# Patient Record
Sex: Female | Born: 1940 | Race: White | Hispanic: No | Marital: Married | Smoking: Never smoker
Health system: Southern US, Community
[De-identification: ages and names within clinical notes are randomized; demographics above are authoritative.]

## PROBLEM LIST (undated history)

## (undated) DIAGNOSIS — I25810 Atherosclerosis of coronary artery bypass graft(s) without angina pectoris: Secondary | ICD-10-CM

## (undated) DIAGNOSIS — E782 Mixed hyperlipidemia: Secondary | ICD-10-CM

## (undated) DIAGNOSIS — I10 Essential (primary) hypertension: Secondary | ICD-10-CM

## (undated) DIAGNOSIS — I6529 Occlusion and stenosis of unspecified carotid artery: Secondary | ICD-10-CM

## (undated) HISTORY — DX: Occlusion and stenosis of unspecified carotid artery: I65.29

## (undated) HISTORY — DX: Mixed hyperlipidemia: E78.2

## (undated) HISTORY — PX: TUBAL LIGATION: SHX77

## (undated) HISTORY — DX: Essential (primary) hypertension: I10

## (undated) HISTORY — DX: Atherosclerosis of coronary artery bypass graft(s) without angina pectoris: I25.810

## (undated) HISTORY — PX: NOSE SURGERY: SHX723

---

## 2000-01-06 ENCOUNTER — Other Ambulatory Visit: Admission: RE | Admit: 2000-01-06 | Discharge: 2000-01-06 | Payer: Self-pay | Admitting: Internal Medicine

## 2000-09-18 ENCOUNTER — Encounter: Payer: Self-pay | Admitting: Family Medicine

## 2000-09-18 ENCOUNTER — Encounter: Admission: RE | Admit: 2000-09-18 | Discharge: 2000-09-18 | Payer: Self-pay | Admitting: Family Medicine

## 2001-10-05 ENCOUNTER — Inpatient Hospital Stay (HOSPITAL_COMMUNITY): Admission: EM | Admit: 2001-10-05 | Discharge: 2001-10-20 | Payer: Self-pay | Admitting: Emergency Medicine

## 2001-10-05 ENCOUNTER — Encounter: Payer: Self-pay | Admitting: Emergency Medicine

## 2001-10-06 ENCOUNTER — Encounter: Payer: Self-pay | Admitting: Cardiology

## 2001-10-10 ENCOUNTER — Encounter: Payer: Self-pay | Admitting: Surgery

## 2001-10-10 HISTORY — PX: CORONARY ARTERY BYPASS GRAFT: SHX141

## 2001-10-11 ENCOUNTER — Encounter: Payer: Self-pay | Admitting: Surgery

## 2001-10-12 ENCOUNTER — Encounter: Payer: Self-pay | Admitting: Surgery

## 2001-10-13 ENCOUNTER — Encounter: Payer: Self-pay | Admitting: Cardiology

## 2001-10-14 ENCOUNTER — Encounter: Payer: Self-pay | Admitting: Surgery

## 2001-10-15 ENCOUNTER — Encounter: Payer: Self-pay | Admitting: Surgery

## 2002-03-05 ENCOUNTER — Encounter: Payer: Self-pay | Admitting: Orthopedic Surgery

## 2002-03-11 ENCOUNTER — Inpatient Hospital Stay (HOSPITAL_COMMUNITY): Admission: RE | Admit: 2002-03-11 | Discharge: 2002-03-16 | Payer: Self-pay | Admitting: Orthopedic Surgery

## 2002-03-11 ENCOUNTER — Encounter: Payer: Self-pay | Admitting: Orthopedic Surgery

## 2002-03-11 HISTORY — PX: OTHER SURGICAL HISTORY: SHX169

## 2002-05-28 ENCOUNTER — Encounter: Payer: Self-pay | Admitting: Orthopedic Surgery

## 2002-06-03 ENCOUNTER — Inpatient Hospital Stay (HOSPITAL_COMMUNITY): Admission: RE | Admit: 2002-06-03 | Discharge: 2002-06-07 | Payer: Self-pay | Admitting: Orthopedic Surgery

## 2002-06-03 ENCOUNTER — Encounter: Payer: Self-pay | Admitting: Orthopedic Surgery

## 2004-04-05 ENCOUNTER — Ambulatory Visit: Payer: Self-pay

## 2004-04-15 ENCOUNTER — Ambulatory Visit: Payer: Self-pay | Admitting: Cardiology

## 2005-02-04 ENCOUNTER — Ambulatory Visit: Payer: Self-pay | Admitting: Cardiology

## 2005-02-22 ENCOUNTER — Ambulatory Visit: Payer: Self-pay | Admitting: Cardiology

## 2005-02-22 ENCOUNTER — Ambulatory Visit: Payer: Self-pay

## 2005-08-23 ENCOUNTER — Ambulatory Visit: Payer: Self-pay

## 2006-01-31 ENCOUNTER — Ambulatory Visit: Payer: Self-pay | Admitting: Cardiology

## 2006-02-07 ENCOUNTER — Ambulatory Visit: Payer: Self-pay | Admitting: Cardiology

## 2006-07-31 ENCOUNTER — Ambulatory Visit: Payer: Self-pay | Admitting: Cardiology

## 2006-08-04 ENCOUNTER — Ambulatory Visit: Payer: Self-pay

## 2007-02-13 ENCOUNTER — Ambulatory Visit: Payer: Self-pay | Admitting: Cardiology

## 2007-02-20 ENCOUNTER — Ambulatory Visit: Payer: Self-pay

## 2007-02-20 ENCOUNTER — Ambulatory Visit: Payer: Self-pay | Admitting: Cardiology

## 2007-02-20 LAB — CONVERTED CEMR LAB
ALT: 23 units/L (ref 0–35)
Albumin: 4 g/dL (ref 3.5–5.2)
Alkaline Phosphatase: 50 units/L (ref 39–117)
BUN: 14 mg/dL (ref 6–23)
CO2: 29 meq/L (ref 19–32)
Calcium: 9.4 mg/dL (ref 8.4–10.5)
GFR calc Af Amer: 64 mL/min
GFR calc non Af Amer: 53 mL/min
Potassium: 3.2 meq/L — ABNORMAL LOW (ref 3.5–5.1)
Total CHOL/HDL Ratio: 4.2
Triglycerides: 170 mg/dL — ABNORMAL HIGH (ref 0–149)
VLDL: 34 mg/dL (ref 0–40)

## 2007-08-21 ENCOUNTER — Ambulatory Visit: Payer: Self-pay

## 2007-08-23 ENCOUNTER — Ambulatory Visit: Payer: Self-pay | Admitting: Cardiology

## 2007-10-03 ENCOUNTER — Ambulatory Visit: Payer: Self-pay | Admitting: Cardiology

## 2007-10-03 LAB — CONVERTED CEMR LAB
BUN: 17 mg/dL (ref 6–23)
CO2: 34 meq/L — ABNORMAL HIGH (ref 19–32)
Chloride: 105 meq/L (ref 96–112)
Creatinine, Ser: 1 mg/dL (ref 0.4–1.2)
GFR calc non Af Amer: 59 mL/min
Glucose, Bld: 108 mg/dL — ABNORMAL HIGH (ref 70–99)
Potassium: 4.8 meq/L (ref 3.5–5.1)

## 2007-10-23 ENCOUNTER — Ambulatory Visit: Payer: Self-pay | Admitting: Cardiology

## 2008-04-23 ENCOUNTER — Ambulatory Visit: Payer: Self-pay | Admitting: Cardiology

## 2008-05-01 ENCOUNTER — Ambulatory Visit: Payer: Self-pay | Admitting: Cardiology

## 2008-05-01 LAB — CONVERTED CEMR LAB
ALT: 22 units/L (ref 0–35)
Bilirubin, Direct: 0.2 mg/dL (ref 0.0–0.3)
HDL: 28.8 mg/dL — ABNORMAL LOW (ref >39.0)
Hgb A1c MFr Bld: 6.4 % — ABNORMAL HIGH (ref 4.6–6.0)
LDL Cholesterol: 84 mg/dL (ref 0–99)
Total Bilirubin: 1.4 mg/dL — ABNORMAL HIGH (ref 0.3–1.2)
Total CHOL/HDL Ratio: 4.7
VLDL: 22 mg/dL (ref 0–40)

## 2008-08-20 ENCOUNTER — Ambulatory Visit: Payer: Self-pay | Admitting: Cardiology

## 2008-08-20 ENCOUNTER — Encounter: Payer: Self-pay | Admitting: Cardiology

## 2008-08-20 ENCOUNTER — Ambulatory Visit: Payer: Self-pay

## 2008-08-20 DIAGNOSIS — I10 Essential (primary) hypertension: Secondary | ICD-10-CM | POA: Insufficient documentation

## 2008-08-20 DIAGNOSIS — E785 Hyperlipidemia, unspecified: Secondary | ICD-10-CM | POA: Insufficient documentation

## 2008-08-20 DIAGNOSIS — I6529 Occlusion and stenosis of unspecified carotid artery: Secondary | ICD-10-CM | POA: Insufficient documentation

## 2008-08-20 DIAGNOSIS — I25810 Atherosclerosis of coronary artery bypass graft(s) without angina pectoris: Secondary | ICD-10-CM | POA: Insufficient documentation

## 2008-12-26 ENCOUNTER — Encounter (INDEPENDENT_AMBULATORY_CARE_PROVIDER_SITE_OTHER): Payer: Self-pay | Admitting: *Deleted

## 2009-03-20 ENCOUNTER — Ambulatory Visit: Payer: Self-pay | Admitting: Cardiology

## 2009-04-20 ENCOUNTER — Telehealth (INDEPENDENT_AMBULATORY_CARE_PROVIDER_SITE_OTHER): Payer: Self-pay

## 2009-04-21 ENCOUNTER — Ambulatory Visit: Payer: Self-pay | Admitting: Cardiology

## 2009-04-21 ENCOUNTER — Encounter (HOSPITAL_COMMUNITY): Admission: RE | Admit: 2009-04-21 | Discharge: 2009-05-27 | Payer: Self-pay | Admitting: Cardiology

## 2009-04-21 ENCOUNTER — Ambulatory Visit: Payer: Self-pay

## 2009-04-24 LAB — CONVERTED CEMR LAB
ALT: 18 units/L (ref 0–35)
Albumin: 4.2 g/dL (ref 3.5–5.2)
BUN: 13 mg/dL (ref 6–23)
Bilirubin, Direct: 0.1 mg/dL (ref 0.0–0.3)
Cholesterol: 147 mg/dL (ref 0–200)
Creatinine, Ser: 1.1 mg/dL (ref 0.4–1.2)
GFR calc non Af Amer: 52.21 mL/min (ref >60)
Hgb A1c MFr Bld: 6.3 % (ref 4.6–6.5)
Potassium: 4.1 meq/L (ref 3.5–5.1)
Total Protein: 8 g/dL (ref 6.0–8.3)
Triglycerides: 132 mg/dL (ref 0.0–149.0)
VLDL: 26.4 mg/dL (ref 0.0–40.0)

## 2009-08-21 ENCOUNTER — Encounter: Payer: Self-pay | Admitting: Cardiology

## 2009-08-24 ENCOUNTER — Ambulatory Visit: Payer: Self-pay

## 2009-08-24 ENCOUNTER — Ambulatory Visit: Payer: Self-pay | Admitting: Cardiology

## 2009-08-24 DIAGNOSIS — I739 Peripheral vascular disease, unspecified: Secondary | ICD-10-CM | POA: Insufficient documentation

## 2010-03-15 ENCOUNTER — Ambulatory Visit: Payer: Self-pay | Admitting: Cardiology

## 2010-06-27 LAB — CONVERTED CEMR LAB
Bilirubin, Direct: 0.2 mg/dL (ref 0.0–0.3)
Calcium: 9.3 mg/dL (ref 8.4–10.5)
Chloride: 104 meq/L (ref 96–112)
Creatinine, Ser: 1 mg/dL (ref 0.4–1.2)
HDL: 38.3 mg/dL — ABNORMAL LOW (ref >39.00)
LDL Cholesterol: 71 mg/dL (ref 0–99)
Sodium: 142 meq/L (ref 135–145)
Total Bilirubin: 1.2 mg/dL (ref 0.3–1.2)
Total CHOL/HDL Ratio: 3
Triglycerides: 79 mg/dL (ref 0.0–149.0)
VLDL: 15.8 mg/dL (ref 0.0–40.0)

## 2010-06-29 NOTE — Miscellaneous (Signed)
Summary: Orders Update  Clinical Lists Changes  Orders: Added new Test order of Carotid Duplex (Carotid Duplex) - Signed 

## 2010-06-29 NOTE — Assessment & Plan Note (Signed)
Summary: rov appt with missy at 3pm      Allergies Added: NKDA  Visit Type:  rov Primary Provider:  Harrison Mons  CC:  shortness of breath when going up hall.  History of Present Illness: Ms Verge comes in today for evaluation and management of her vascular disease and cardiac disease.  She's not had any angina or ischemic symptoms. Her last stress Myoview was negative for ischemia.  She's had no symptoms of TIAs or mini strokes. Her carotids today shows 60-79% bilateral carotid stenoses with antegrade flow in both vertebrals. There was no sign of subclavian stenosis.  She denies any orthopnea, PND or peripheral edema. She has lost about 5 pounds. She is very compliant with her medications.  Current Medications (verified): 1)  Potassium Chloride Cr 10 Meq Cr-Tabs (Potassium Chloride) .Marland Kitchen.. 1 Tab Once Daily 2)  Vytorin 10-40 Mg Tabs (Ezetimibe-Simvastatin) .Marland Kitchen.. 1 Tab Once Daily 3)  Amlodipine Besy-Benazepril Hcl 5-20 Mg Caps (Amlodipine Besy-Benazepril Hcl) .Marland Kitchen.. 1 Tab Once Daily 4)  Hydrochlorothiazide 12.5 Mg Tabs (Hydrochlorothiazide) .Marland Kitchen.. 1 Tab Once Daily 5)  Carvedilol 25 Mg Tabs (Carvedilol) .Marland Kitchen.. 1 Tab Two Times A Day 6)  Aspirin 81 Mg Tbec (Aspirin) .... Take One Tablet By Mouth Daily 7)  Fish Oil   Oil (Fish Oil) .... Temp Stop 8)  Multivitamins   Tabs (Multiple Vitamin) .Marland Kitchen.. 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  Alcohol Use - no Drug Use - no  Risk Factors: Smoking Status: never (Sep 16, 2008)  Review of Systems       negative otherwise  Vital Signs:  Patient profile:   70 year old female Height:      67 inches Weight:      185 pounds BMI:     29.08 BP sitting:   108 / 60  (left arm) BP standing:   122 / 60  (right arm) Cuff size:   large  Vitals Entered By: Oswald Hillock (August 24, 2009 3:21 PM)  Physical Exam  General:  overweight Head:  normocephalic and atraumatic Eyes:  PERRLA/EOM intact; conjunctiva and lids normal. Neck:  Neck supple, no JVD. No masses, thyromegaly or abnormal cervical nodes. Chest Sheritta Deeg:  no deformities or breast masses noted Lungs:  Clear bilaterally to auscultation and percussion. Heart:  PMI difficult to appreciate, normal S1-S2 regular rate and rhythm Msk:  Back normal, normal gait. Muscle strength and tone normal. Pulses:  pulses normal in all 4 extremities Extremities:  No clubbing or cyanosis. Neurologic:  Alert and oriented x 3. Skin:  Intact without lesions or rashes. Psych:  Normal affect.   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 1 tab once daily    Carvedilol 25 Mg Tabs (Carvedilol) .Marland Kitchen... 1 tab two times a day  Aspirin 81 Mg Tbec (Aspirin) .Marland Kitchen... 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 Her updated medication list for this problem includes:    Aspirin 81 Mg Tbec (Aspirin) .Marland Kitchen... Take one tablet by mouth daily  Problem # 3:  HYPERTENSION, BENIGN  (ICD-401.1) Assessment: Improved  Her updated medication list for this problem includes:    Amlodipine Besy-benazepril Hcl 5-20 Mg Caps (Amlodipine besy-benazepril hcl) .Marland Kitchen... 1 tab once daily    Hydrochlorothiazide 12.5 Mg Tabs (Hydrochlorothiazide) .Marland Kitchen... 1 tab once daily    Carvedilol 25 Mg Tabs (Carvedilol) .Marland Kitchen... 1 tab two times a day    Aspirin 81 Mg Tbec (Aspirin) .Marland Kitchen... Take one tablet by mouth daily  Problem # 4:  HYPERLIPIDEMIA-MIXED (ICD-272.4)  Her updated medication list for this problem includes:    Vytorin 10-40 Mg Tabs (Ezetimibe-simvastatin) .Marland Kitchen... 1 tab once daily  Problem # 5:  PVD (ICD-443.9) Assessment: New She t.has about a 12-14 mm difference in systolic pressure between the left upper extremity and right upper extremity. She most likely has some subclavian stenosis is asymptomatic.  Clinical Reports Reviewed:  Nuclear Study:  04/21/2009:  Excerise capacity: Good exercise capacity  Blood Pressure response: Normal blood pressure response  Clinical symptoms: No chest pain  ECG impression: No significant ST segment change suggestive of ischemia Overall impression: There is an old, dense antero[apical scar. There is no ischemia.  Willa Rough, MD, [facility]   02/20/2007:  Excerise capacity: Good exercise capactiy  Blood Pressure response: Normal blood pressure response  Clinical symptoms: Fatigue  ECG impression: No significant ST segment change suggestive of ischemia  Overall impression: No change from 2006. There is an old anter-apical scar. There is mild peri-infarct ischemia.   02/22/2005:  Final Interpretation: Abnormal stress Myoview with no chest pain and no electrocardiographic changes. The scintigraphic results show a large prior distal anterior and apical infarct, but there is no ischemia on this study. the gated ejection fraction was 57%, and there was akinesis of the apex.   Patient Instructions: 1)  Your physician recommends that you schedule a  follow-up appointment in: 6 MONTHS WITH DR Montserrath Madding 2)  Your physician recommends that you continue on your current medications as directed. Please refer to the Current Medication list given to you today.

## 2010-06-29 NOTE — Assessment & Plan Note (Signed)
Summary: 6 mon f/u./cy   Visit Type:  6 mo f/u Primary Provider:  Harrison Mons  CC:  sob w/inclines...edema/feet in the summer....denies any cp.  History of Present Illness: [name] comes in today for further evaluation and management of her coronary artery disease, carotid artery disease, hyperlipidemia, and hypertension.  She's had no symptoms of angina or ischemia. No symptoms of TIAs or mini strokes.  Her weight is stable.  Last stress Myoview November 2000 and stable. EF 61%. Please see report.  Carotid Dopplers August 24, 2009 were stable. Followup in a year.  Laboratory data a year ago in November showed total cholesterol 147, triglycerides 132, HDL 35.6, LDL 85, LFTs were normal, chemistry was normal. Reviewed with patient today.  She is compliant with medications. May profile reviewed.  Clinical Reports Reviewed:  Cardiac Cath:  10/05/2001: Cardiac Cath Findings:  CONCLUSIONS: Acute anterior wall myocardial infarction complicated by congestive heart failure with 99% stenosis in the proximal to mid left anterior descending with diffuse disease, and TIMI-3 flow, 80 and 90% stenosis in the first and second posterolateral branch of the circumflex artery, 70% stenosis in the mid right coronary artery with 90% in the posterior descending branch and anterolateral wall and apical wall akinesis and inferoapical wall hypokinesis with an ejection fraction of 25-30%.  RECOMMENDATIONS: The patients coronary anatomy is very unfavorable for percutaneous intervention. We will plan to stabilize the patient with medical therapy with ACE inhibitor, beta blockers, heparin and Integrilin. If she has recurrent pain or hemodynamic compromise, then we will recommend placement of an intra-aortic balloon pump. Hopefully, we will be able to stabilize her condition medically, and hopefully she will have some improvement in left ventricular function and be a candidate for bypass surgery.  We have CVTS group to see her with [place] and follow her with [place]. Dictated by:   Everardo Beals Juanda Chance, M.D. [facility] Attending Physician:  Mirian Mo DD:  10/06/01  Nuclear Study:  04/21/2009:  Excerise capacity: Good exercise capacity  Blood Pressure response: Normal blood pressure response  Clinical symptoms: No chest pain  ECG impression: No significant ST segment change suggestive of ischemia Overall impression: There is an old, dense antero[apical scar. There is no ischemia.  Willa Rough, MD, [facility]   02/20/2007:  Excerise capacity: Good exercise capactiy  Blood Pressure response: Normal blood pressure response  Clinical symptoms: Fatigue  ECG impression: No significant ST segment change suggestive of ischemia  Overall impression: No change from 2006. There is an old anter-apical scar. There is mild peri-infarct ischemia.   02/22/2005:  Final Interpretation: Abnormal stress Myoview with no chest pain and no electrocardiographic changes. The scintigraphic results show a large prior distal anterior and apical infarct, but there is no ischemia on this study. the gated ejection fraction was 57%, and there was akinesis of the apex.  Carotid Doppler:  08/24/2009:  Impressions: Stable mild to moderate plaque, over multiple serial exams. 60-79% bilateral ICA stenosis, low end of range.  Tonny Bollman, MD  08/21/2007:  Impressions Moderate carotid disease, bilaterally, which has been stable over the past sevreal years. 60-79% bilateral ICA stenoses.  08/04/2006:  Impressions Stable bilateral carotid artery disease, over serial exams. 60-79% bilateral ICA stenosis.   Current Medications (verified): 1)  Potassium Chloride Cr 10 Meq Cr-Tabs (Potassium Chloride) .Marland Kitchen.. 1 Tab Once Daily 2)  Vytorin 10-40 Mg Tabs (Ezetimibe-Simvastatin) .Marland Kitchen.. 1 Tab Once Daily 3)  Amlodipine Besy-Benazepril Hcl 5-20 Mg Caps (Amlodipine Besy-Benazepril Hcl) .Marland Kitchen.. 1 Tab Once Daily  4)   Hydrochlorothiazide 12.5 Mg Tabs (Hydrochlorothiazide) .Marland Kitchen.. 1 Tab Once Daily 5)  Carvedilol 25 Mg Tabs (Carvedilol) .Marland Kitchen.. 1 Tab Two Times A Day 6)  Aspirin 81 Mg Tbec (Aspirin) .... Take One Tablet By Mouth Daily 7)  Multivitamins   Tabs (Multiple Vitamin) .Marland Kitchen.. 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  Alcohol Use - no Drug Use - no  Risk Factors: Smoking Status: never (09-10-2008)  Review of Systems       negative other than history of present illness  Vital Signs:  Patient profile:   70 year old female Height:      67 inches Weight:      184 pounds BMI:     28.92 Pulse rate:   59 / minute Pulse rhythm:   irregular BP sitting:   130 / 70  (left arm) Cuff size:   large  Vitals Entered By: Danielle Rankin, CMA (March 15, 2010 11:11 AM)  Physical Exam  General:  overweight, in no acute distress, pleasant Head:  normocephalic and atraumatic Eyes:  PERRLA/EOM intact; conjunctiva and lids normal. Neck:  Neck supple, no JVD. No masses, thyromegaly or abnormal cervical nodes. Chest Wall:  no deformities or breast masses noted Lungs:  Clear bilaterally to auscultation and percussion. Heart:  PMI difficult to appreciate, soft S1-S2, no murmur rub or gallop. Regular rate and rhythm, bilateral carotid bruits Msk:  Back normal, normal gait. Muscle  strength and tone normal. Pulses:  pulses normal in all 4 extremities Extremities:  No clubbing or cyanosis. Neurologic:  Alert and oriented x 3. Skin:  Intact without lesions or rashes. Psych:  Normal affect.   EKG  Procedure date:  03/15/2010  Findings:      sinus bradycardia, low voltage, old anterior septal infarct, no change ST segment depression T wave inversion in one aVL.  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 Her updated medication list for this problem includes:    Amlodipine Besy-benazepril Hcl 5-20 Mg Caps (Amlodipine besy-benazepril hcl) .Marland Kitchen... 1 tab once daily    Carvedilol 25 Mg Tabs (Carvedilol) .Marland Kitchen... 1 tab two times a day    Aspirin 81 Mg Tbec (Aspirin) .Marland Kitchen... 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 Take one tablet by mouth daily  Her updated medication list for this problem includes:    Aspirin 81 Mg Tbec (Aspirin) .Marland Kitchen... Take one tablet by mouth daily  Problem # 4:  HYPERTENSION, BENIGN (ICD-401.1) Assessment: Improved  fasting blood work today Her updated medication  list for this problem includes:    Amlodipine Besy-benazepril Hcl 5-20 Mg Caps (Amlodipine besy-benazepril hcl) .Marland Kitchen... 1 tab once daily    Hydrochlorothiazide 12.5 Mg Tabs (Hydrochlorothiazide) .Marland Kitchen... 1 tab once daily    Carvedilol 25 Mg Tabs (Carvedilol) .Marland Kitchen... 1 tab two times a day    Aspirin 81 Mg Tbec (Aspirin) .Marland Kitchen... Take one tablet by mouth daily  Her updated medication  list for this problem includes:    Amlodipine Besy-benazepril Hcl 5-20 Mg Caps (Amlodipine besy-benazepril hcl) .Marland Kitchen... 1 tab once daily    Hydrochlorothiazide 12.5 Mg Tabs (Hydrochlorothiazide) .Marland Kitchen... 1 tab once daily    Carvedilol 25 Mg Tabs (Carvedilol) .Marland Kitchen... 1 tab two times a day    Aspirin 81 Mg Tbec (Aspirin) .Marland Kitchen... Take one tablet by mouth daily  Problem # 5:  HYPERTENSION, BENIGN (ICD-401.1)  Her updated medication list for this problem includes:    Amlodipine Besy-benazepril Hcl 5-20 Mg Caps (Amlodipine besy-benazepril hcl) .Marland Kitchen... 1 tab once daily    Hydrochlorothiazide 12.5 Mg Tabs (Hydrochlorothiazide) .Marland Kitchen... 1 tab once daily    Carvedilol 25 Mg Tabs (Carvedilol) .Marland Kitchen... 1 tab two times a day    Aspirin 81 Mg Tbec (Aspirin) .Marland Kitchen... Take one tablet by mouth daily  Her updated medication list for this problem includes:    Amlodipine Besy-benazepril Hcl 5-20 Mg Caps (Amlodipine besy-benazepril hcl) .Marland Kitchen... 1 tab once daily    Hydrochlorothiazide 12.5 Mg Tabs (Hydrochlorothiazide) .Marland Kitchen... 1 tab once daily    Carvedilol 25 Mg Tabs (Carvedilol) .Marland Kitchen... 1 tab two times a day    Aspirin 81 Mg Tbec (Aspirin) .Marland Kitchen... Take one tablet by mouth daily  Patient Instructions: 1)  Your physician recommends that you schedule a follow-up appointment in: 6 months with Dr. Daleen Squibb 2)  Your physician recommends that you have FASTING lipid profile,cmp  3)  Your physician recommends that you continue on your current medications as directed. Please refer to the Current Medication list given to you today.

## 2010-10-06 ENCOUNTER — Encounter: Payer: Self-pay | Admitting: Cardiology

## 2010-10-07 ENCOUNTER — Other Ambulatory Visit: Payer: Self-pay | Admitting: Cardiology

## 2010-10-07 DIAGNOSIS — I6529 Occlusion and stenosis of unspecified carotid artery: Secondary | ICD-10-CM

## 2010-10-08 ENCOUNTER — Encounter: Payer: Self-pay | Admitting: Cardiology

## 2010-10-08 ENCOUNTER — Ambulatory Visit (INDEPENDENT_AMBULATORY_CARE_PROVIDER_SITE_OTHER): Payer: BC Managed Care – PPO | Admitting: Cardiology

## 2010-10-08 ENCOUNTER — Encounter (INDEPENDENT_AMBULATORY_CARE_PROVIDER_SITE_OTHER): Payer: BC Managed Care – PPO | Admitting: Cardiology

## 2010-10-08 VITALS — BP 132/70 | HR 53 | Resp 14 | Ht 66.0 in | Wt 184.0 lb

## 2010-10-08 DIAGNOSIS — E785 Hyperlipidemia, unspecified: Secondary | ICD-10-CM

## 2010-10-08 DIAGNOSIS — I10 Essential (primary) hypertension: Secondary | ICD-10-CM

## 2010-10-08 DIAGNOSIS — I6529 Occlusion and stenosis of unspecified carotid artery: Secondary | ICD-10-CM

## 2010-10-08 DIAGNOSIS — I2510 Atherosclerotic heart disease of native coronary artery without angina pectoris: Secondary | ICD-10-CM

## 2010-10-08 DIAGNOSIS — I25810 Atherosclerosis of coronary artery bypass graft(s) without angina pectoris: Secondary | ICD-10-CM

## 2010-10-08 NOTE — Patient Instructions (Signed)
Your physician recommends that you return for lab work in:  October 2012 for fasting labs Your physician recommends that you schedule a follow-up appointment in: October with Dr. Daleen Squibb    .

## 2010-10-08 NOTE — Assessment & Plan Note (Signed)
Improved, no change in treatment.

## 2010-10-08 NOTE — Assessment & Plan Note (Signed)
Labs reviewed with pt from 10/11. Repeat in October.

## 2010-10-08 NOTE — Assessment & Plan Note (Signed)
Stable, no change in treatment.

## 2010-10-08 NOTE — Progress Notes (Signed)
   Patient ID: Sophia Schroeder, female    DOB: April 14, 1941, 70 y.o.   MRN: [id number]  HPI Mrs Bagsby comes in for E and M of her CAD, CVD, HTN, and MHL.  She is having no angina. She denies any sxs of TIA's. She is very compliant with her meds. Lipids reviewed and were close to goal in 10/11. Weight is stable.  EKG shows NSR with old ASMI.    Review of Systems  All other systems reviewed and are negative.      Physical Exam  Nursing note and vitals reviewed. Constitutional: She is oriented to person, place, and time. She appears well-developed and well-nourished. No distress.       obese  HENT:  Head: Normocephalic and atraumatic.  Eyes: EOM are normal. Pupils are equal, round, and reactive to light.  Neck: Neck supple. No JVD present. No tracheal deviation present. No thyromegaly present.  Cardiovascular: Normal rate, regular rhythm, S1 normal, S2 normal and intact distal pulses.   No extrasystoles are present. PMI is not displaced.     No systolic murmur is present   No diastolic murmur is present  Pulmonary/Chest: Effort normal and breath sounds normal.  Abdominal: Soft. Bowel sounds are normal.  Musculoskeletal: Normal range of motion. She exhibits no edema.  Neurological: She is alert and oriented to person, place, and time.  Skin: Skin is warm and dry.  Psychiatric: She has a normal mood and affect.

## 2010-10-08 NOTE — Assessment & Plan Note (Signed)
Carotid dopplers stable today. No change in treatment.

## 2010-10-12 NOTE — Assessment & Plan Note (Signed)
[facility]                            CARDIOLOGY OFFICE NOTE   CHOUA, IKNER                        MRN:          [id number]  DATE:04/23/2008                            DOB:          [birth year]    Sophia Schroeder comes in today for followup.  She is doing remarkably well  with no symptoms of angina, ischemia, or TIAs.   Her blood pressure has been much better controlled with the addition of  amlodipine/benazepril 5/20.  Rest of her meds are unchanged.  She is not  on Mavik any longer.   She is due lipids and LFTs as well as a hemoglobin A1c.   Her weight is up about 4 pounds to 191.   Her blood pressure is 122/47, pulse 59 and regular.  HEENT is normal.  Carotid upstrokes were equal bilaterally with a soft right carotid  bruit.  Thyroid is not enlarged.  Trachea is midline.  Lungs are clear  to auscultation and percussion.  Heart reveals a poorly appreciated PMI.  Normal S1 and S2.  No gallop.  Abdominal exam is soft.  Good bowel  sounds.  No pulsatile mass.  No hepatomegaly or tenderness.  Extremities  reveal no cyanosis, clubbing, or edema.  Pulses are intact.  Neuro exam  is intact.  Skin is unremarkable except for a bruises.   EKG was not repeated today.   [name] is doing remarkably well.  She is due fasting lipids,  comprehensive metabolic panel, and will check a hemoglobin A1c as well.  We will continue her current medications.  I will see her back in March  2010 for carotid Dopplers and an office visit.     Thomas C. Daleen Squibb, MD, [facility]  Electronically Signed    TCW/MedQ  DD: 04/23/2008  DT: 04/23/2008  Job #: [id number]

## 2010-10-12 NOTE — Assessment & Plan Note (Signed)
[facility]                            CARDIOLOGY OFFICE NOTE   Sophia Schroeder, Sophia Schroeder                        MRN:          [id number]  DATE:08/23/2007                            DOB:          06/23/1940    Sophia Schroeder returns today for follow-up of the following issues:  1. Coronary artery disease.  She is status post anterior wall infarct      in 2003.  She had coronary artery bypass grafting.  Her ejection      fraction in the past had been 45-55% with large anterior apical      infarct.  She is having no angina.  She does have some dyspnea on      exertion with climbing steps or getting in a hurry.  She denies      orthopnea, PND, peripheral edema to speak of.  Stress Myoview      February 20, 2007 showed an EF 53% with relatively good exercise      tolerance.  She had anterior apical scar with mild peri-infarct      ischemia.  2. Hypertension.  3. Hyperlipidemia.  She was at goal September 2008 except for low HDL.      She is on Vytorin.  4. Nonobstructive progressive carotid disease.  Carotid Dopplers August 21, 2007 showed no change was stable with stable  carotid disease      for the last several years.  She has 60-79% bilateral stenoses.      There was antegrade flow in both vertebrals.  She is asymptomatic.   I am little concerned about her blood pressure.  Each time she comes in  here her systolic is a little bit high.  She says she checks it at  [facility] and is always good.  However, she cannot remember the value.   Her medications are:  1. Aspirin 81 mg a day.  2. Mavik 4 mg a day.  3. Coreg 25 mg p.o. b.i.d.  4. Hydrochlorothiazide 12.5 mg a day.  5. Multivitamin daily.  6. Citracal and D daily.  7. Potassium 10 mEq a day.  8. Vytorin 10/40 daily.  9. She takes Ambien p.r.n. for sleep.   Her blood pressure was 151/61.  Her pulse is 55 and sinus brady.  EKG  confirms.  She has borderline first-degree AV block and a slightly  rightward axis.  This is stable.  Her weight is 187, down 2.  HEENT:  Normocephalic, atraumatic.  PERRLA.  Extraocular movements  intact.  Sclera clear.  Facial symmetry normal.  Carotids are equal bilaterally without bruits.  No JVD.  Thyroid is not  enlarged.  Trachea is midline.  NECK:  Is supple.  Her carotid upstrokes are equal.  She has soft  systolic sounds bilaterally.  LUNGS:  Clear.  HEART:  Reveals a poorly appreciated PMI.  Normal S1-S2 without gallop.  ABDOMINAL EXAM:  Was soft, good bowel sounds.  There was no obvious  organomegaly.  EXTREMITIES:  Reveal no edema.  Pulses are present 2+/4+ bilaterally  symmetrical.  NEUROLOGIC EXAM:  Is intact.  SKIN:  Is unremarkable.  She has some chronic arthritic changes on musculoskeletal exam.  Her affect and psychiatric exam is normal.  She is always upbeat and  bright.  She is a little emotional with the anniversary of losing her  son to a gunshot wound 10 years ago.   ASSESSMENT:  Sophia Schroeder seems be doing well.  I am concerned about her  blood pressure.  We had a long talk about that today including dietary  restrictions, sodium restriction, etc.  We also emphasized staying  active with walking.   When she runs out of her Mavik, I am going to switch her over to  amlodipine/benazepril 5/20 q.a.m.  Once she switches over, we will her  come back for blood pressure check and a chemistry.  She just had a  Mavik refill, but there is no hurry.  I told her just to finish this out  before she starts on the new medication.   Otherwise, we will see her back in September.  At that time, she will be  due fasting blood work including lipids and comprehensive metabolic  panel.     Thomas C. Daleen Squibb, MD, [facility]  Electronically Signed    TCW/MedQ  DD: 08/23/2007  DT: 08/24/2007  Job #: [id number]   cc:   Gretta Arab. Valentina Lucks, M.D.

## 2010-10-12 NOTE — Assessment & Plan Note (Signed)
[facility] HEALTHCARE                            CARDIOLOGY OFFICE NOTE   Sophia Schroeder, Sophia Schroeder                        MRN:          [id number]  DATE:02/13/2007                            DOB:          1941-01-23    Sophia Schroeder returns today for further management of the following  issues.  1. Coronary artery disease.  She is having no angina or ischemic      symptoms.  Last stress Myoview February 22, 2005 showed an apical      infarct, but no ischemia.  EF 57%.  2. Hypertension.  3. Hyperlipidemia.  She is due lipids.  They are always at goal,      except for a low HDL.  She is on Vytorin.  4. Nonobstructive but progressive carotid disease.  Her carotid      Dopplers March 2008 showed 60-79% bilateral internal carotid artery      stenosis.  She has antegrade flow in both vertebrals.  She is      asymptomatic.   MEDICATIONS:  1. Aspirin 81 mg a day.  2. Mavik 4 mg a day.  3. Coreg 25 mg p.o. b.i.d.  4. Hydrochlorothiazide 12.5 mg a day.  5. Once a day vitamin.  6. Citracal and vitamin D.  7. Vytorin 10/40 daily.   EXAM:  Blood pressure is 144/80, pulse 54 and regular.  Her  electrocardiogram shows low voltage, normal sinus rhythm, poor R wave  progression across the anterior precordium, slightly rightward axis.  This is unchanged.  Her weight is 189.  HEENT:  Normocephalic, atraumatic.  PERRLA.  Extraocular muscles are  intact.  Sclerae clear.  Facial symmetry is normal.  Carotid upstrokes are equal bilaterally with soft bruits bilaterally.  No JVD.  Thyroid is not enlarged.  Trachea is midline.  LUNGS:  Clear.  CARDIAC:  Reveals a normal S1, S2 without murmur, rub, or gallop.  ABDOMEN:  Soft.  Good bowel sounds.  EXTREMITIES:  No cyanosis, clubbing.  Only trace edema.  Pulses are  intact.  NEURO:  Intact.   ASSESSMENT AND PLAN:  Sophia Schroeder is stable from a cardiovascular and  cerebrovascular standpoint.  I have made no change in her program.   We will have her come back for an exercise rest-stress Myoview off of  Coreg.  We will check fasting lipids and a comprehensive metabolic  panel.  Assuming these are stable, I will see her back in March 2009, at  which time she will be due carotid Dopplers.     Thomas C. Daleen Squibb, MD, [facility]  Electronically Signed    TCW/MedQ  DD: 02/13/2007  DT: 02/14/2007  Job #: [id number]   cc:   Gretta Arab. Valentina Lucks, M.D.

## 2010-10-12 NOTE — Assessment & Plan Note (Signed)
[facility] HEALTHCARE                            CARDIOLOGY OFFICE NOTE   JULIANNE, CHAMBERLIN                        MRN:          [id number]  DATE:10/23/2007                            DOB:          [birth year]    Ms. Mikels returns today for further management of her hypertension.  Our last visit she was 151/61.  I have switched her from [facility] to  amlodipine/benazepril 5/20 q.a.m.Marland Kitchen  She has not started that yet.  Blood  pressure rest actually looked pretty good.  Some of them are in the  130s, however. Her other medicines are unchanged.   PHYSICAL EXAMINATION:  VITAL SIGNS:  Blood pressure 138/73, pulse 55 and  regular.  Weight is 187.  HEENT:  Unchanged.  NECK:  Carotids are full without bruits, no JVD.  Thyroid is not  enlarged.  Trachea is midline.  LUNGS:  Clear.  HEART:  Reveals a poorly appreciated PMI.  She has normal S1 and S2  without gallop.  ABDOMEN:  Soft, good bowel sounds.  EXTREMITIES:  No cyanosis, clubbing or edema.  Pulses are intact.  NEUROLOGIC:  Intact.   I still think that Ms. Garbett needs to be on amlodipine/benazepril for  preventing left ventricular dysfunction and congestive failure.  She  will start this.  In addition, I have started her on fish oil 1000 mg  day of omega III.  I will see her back in 6 months.     Thomas C. Daleen Squibb, MD, [facility]  Electronically Signed    TCW/MedQ  DD: 10/23/2007  DT: 10/23/2007  Job #: [id number]

## 2010-10-13 ENCOUNTER — Encounter: Payer: Self-pay | Admitting: Cardiology

## 2010-10-15 NOTE — Consult Note (Signed)
[place]. [facility]  Patient:    Sophia Schroeder, Sophia Schroeder Visit Number: [id number] MRN: [id number]          Service Type: MED Location: CCUB 2903 01 Attending Physician:  Mirian Mo Dictated by:   Alleen Borne, M.D. Proc. Date: 10/07/01 Admit Date:  10/05/2001   CC:         Thomas C. Wall, M.D. [facility]   Consultation Report  REFERRING PHYSICIAN:  Jesse Sans. Wall, M.D.  REASON FOR CONSULTATION:  Severe three vessel coronary artery disease status post acute anterolateral MI.  HISTORY OF PRESENT ILLNESS:  This patient is a 70 year old woman without any prior cardiac history who developed severe substernal chest pain about 8:45 p.m. on Friday with a clammy feeling.  She had had some indigestion just before this while eating pizza and pickles for dinner.  Her husband called 911 and she received aspirin and two sublingual nitroglycerin en route to a hospital.  Her chest pain had essentially relieved by time of arrival. Electrocardiogram in the field and in the hospital showed a large anterolateral MI.  Her CPK enzymes were elevated.  She underwent immediate cardiac catheterization which showed severe three vessel coronary disease with severe left ventricular dysfunction.  The LAD had 70% proximal and 99 and 95% mid LAD stenoses centered around the takeoff of a small second diagonal branch.  The left circumflex had 80% obtuse marginal stenosis.  The right coronary artery was a large dominant vessel that had 70% mid vessel stenosis. There was a 90% posterior descending stenosis.  Left ventriculogram showed anteroapical and inferoapical akinesis.  There is 1-2+ mitral regurgitation. Pulmonary artery pressure was 27/12 with a right atrial pressure of 7. Patient remained hemodynamically stable and did not require intra-aortic balloon pump.  Since then she has remained pain-free.  Her peak CPK was 4476 with an MB fraction of 522.  MEDICATIONS: 1. Atenolol 50 mg  q.d. 2. Diclofenac 100 mg b.i.d. 3. Ambien 5 mg p.r.n. 4. Darvocet p.r.n. for back and hip pain.  PAST MEDICAL HISTORY:  Significant for hypertension.  She has a history of a tubal ligation.  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  She has no history of hyperlipidemia.  REVIEW OF SYSTEMS:  CONSTITUTIONAL:  She has had no fever or chills.  She has had no recent weight changes.  HEENT:  Eyes:  Negative.  ENT:  Negative. ENDOCRINE:  She has no diabetes or hypothyroidism.  CARDIOVASCULAR:  As above. She has had some fatigue over the past two to three weeks.  Prior to that she has felt well.  She has had no peripheral edema.  She has no pain at rest or PND or orthopnea.  RESPIRATORY:  She denies cough and sputum production. GASTROINTESTINAL:  She has had no nausea or vomiting.  She has had some recent heartburn type pain.  She denies melena and bright red blood per rectum. GENITOURINARY:  She denies dysuria and hematuria.  NEUROLOGIC:  She has never had a stroke or TIA.  She has had no focal weakness or numbness. 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  She also has two bulging disks and is scheduled to see Dr. Danielle Dess concerning that in the near future.  ALLERGIES:  None.  HEMATOLOGIC:  She has no history of bleeding disorders or easy bleeding.  PSYCHIATRIC:  Negative.  SOCIAL HISTORY:  She is a Interior and spatial designer by trade, but currently works as a Scientist, physiological at  [employer].  She is a nonsmoker, but lives with her husband who is a smoker.  FAMILY HISTORY:  Positive for coronary artery disease.  Her father had a myocardial infarction.  PHYSICAL EXAMINATION  VITAL SIGNS:  Blood pressure 130/68, pulse 70 and regular, respiratory rate 16 and unlabored.  GENERAL:  She is a well-developed white female in no distress.  HEENT:  Normocephalic, atraumatic.  Pupils are equal and reactive to light and accommodation.  Extraocular muscles are intact.  Throat  is clear.  NECK:  Normal carotid pulses bilaterally.  No bruits.  No adenopathy or thyromegaly.  CARDIAC:  Regular rate and rhythm.  Normal S1 and S2.  There is no murmur, rub, or gallop.  LUNGS:  Clear.  ABDOMEN:  Active bowel sounds.  Soft and nontender.  No palpable masses or organomegaly.  EXTREMITIES:  No peripheral edema.  Pedal pulses are palpable bilaterally.  NEUROLOGIC:  Alert and oriented x3.  Motor and sensory examinations are grossly normal.  SKIN:  Warm and dry.  LABORATORIES:  White blood cell count 15.6, hemoglobin 13.1, hematocrit 38.2, platelet count 282,000.  Electrolytes are within normal limits with an elevated glucose of 183, BUN 18, creatinine 0.8.  Coagulation profile is within normal limits.  Cholesterol elevated at 250 with an HDL that is low of 36 and an LDL that is high of 173.  Triglyceride level is 204.  Chest x-ray on May 9 showed mild pulmonary vascular congestion. Electrocardiogram dated May 10 shows normal sinus rhythm with frequent PVCs. There is an anterolateral infarct.  IMPRESSION:  This patient has severe three vessel coronary artery disease status post large anterolateral myocardial infarction.  I agree that coronary artery bypass graft surgery is the best treatment for this patient.  I would like to give her heart about five days to recover from her myocardial infarction before proceeding with surgery since she is stable without chest pain or shortness of breath.  I discussed the operative procedure with her and her son including alternatives, benefits, and risks including bleeding, blood transfusion, infection, stroke, myocardial infarction, and death.  They understand and agree to proceed with surgery.  I will follow her and plan to do this on Wednesday as long as she remains stable. Dictated by:   Alleen Borne, M.D. Attending Physician:  Mirian Mo DD:  10/07/01 TD:  10/09/01 Job: [id number]

## 2010-10-15 NOTE — Assessment & Plan Note (Signed)
[facility] HEALTHCARE                            CARDIOLOGY OFFICE NOTE   JALILA, GOODNOUGH                        MRN:          [id number]  DATE:07/31/2006                            DOB:          1940-12-02    Ms. Sophia Schroeder returns today for further management of the following issues:  1. Coronary artery disease.  She had a negative stress Myoview on      February 22, 2005.  She has had a previous apical infarct with      akinesia, EF 57%.  She is not having any angina at present.  2. Hypertension.  This has been under better control.  3. Hyperlipidemia.  Her lipids were at goal on April 05, 2004,      except for an HDL of 33.2.  4. Nonobstructive, but progressive carotid disease.  She is due for      carotid Dopplers.  She is asymptomatic.   Her weight is unchanged, in fact she is 2 pounds heavier.  She laughs  and says it is this heavy necklace she is wearing today.   MEDICATIONS:  1. Aspirin 81 mg a day.  2. Mavik 4 mg a day.  3. Coreg 25 b.i.d.  4. Hydrochlorothiazide 12.5 daily once a day.  5. Vitamin Citracal and D daily.  6. Vytorin 10/40 daily.   PHYSICAL EXAMINATION:  VITAL SIGNS:  Blood pressure 139/80, pulse 61 and  regular, weight is 186.  HEENT:  Normocephalic and atraumatic.  PERRLA.  Extraocular movements  intact.  Sclerae clear.  Facial symmetry is normal.  Dentition is  satisfactory.  Carotid upstrokes are equal bilaterally without bruits.  There is no JVD.  Thyroid is not enlarged.  Trachea is midline.  LUNGS:  Clear to auscultation and percussion.  HEART:  Nondisplaced PMI.  She has a normal S1 and S2 without murmurs,  rubs, or gallops.  ABDOMEN:  Soft with good bowel sounds, no midline bruits.  EXTREMITIES:  No cyanosis, clubbing, or edema.  Pulses are intact.  NEUROLOGY:  Intact.   ASSESSMENT:  Ms. Sophia Schroeder is doing well.  We will set her up for carotid  Dopplers.  She will continue her current medicines.  I will plan on  seeing her back in September of 2008.  At that time she will need an  exercise rest stress Myoview.    Thomas C. Daleen Squibb, MD, [facility]  Electronically Signed   TCW/MedQ  DD: 07/31/2006  DT: 07/31/2006  Job #: [id number]   cc:   Gretta Arab. Valentina Lucks, M.D.

## 2010-10-15 NOTE — Assessment & Plan Note (Signed)
[facility] HEALTHCARE                              CARDIOLOGY OFFICE NOTE   ADRAIN, BUTRICK                        MRN:          [id number]  DATE:01/31/2006                            DOB:          1941-05-18    HISTORY:  Ms. Wolters returns today for further management of the following  issues:  1. Coronary artery disease.  She had a negative exercise rest stress      Myoview February 22, 2005, with fairly good exercise tolerance of 7      mets.  She had no ischemia.  Apical and distal large prior infarct.  EF      57%, however, with akinesia at the apex.  She is not having angina at      present.  2. Hypertension.  She says her blood pressure is good when she checks it      outside the office.  She does not use her home cuff very often.  Her      blood pressure is elevated today at 150/70.  I rechecked it.  It was      140/70 after she had rested.  3. Hyperlipidemia.  She is due blood work but her lipids have been at goal      except for low HDL.  4. Carotid artery disease.  Her last carotid Doppler March 2007, showed      antegrade flow in both vertebrals, bilateral 60% to 79% internal      carotid artery stenosis.  It was stable from the previous exam.   EKG shows sinus brady with no ST segment changes.  She has an old anterior  septal wall infarct.   ASSESSMENT/PLAN:  Ms. Date seems to be doing well.  Her weight is up a bit  as is her blood pressure.  She just got back from the beach and has had a  little bit of an indulging summer.   PLAN:  1. Check fasting lipids and LFTs when she is fasting.  2. Weight loss.  3. Check blood pressure outside the office.  4. Follow up with [place] in March of 2008.  At that time, she will need      carotid Dopplers.                               Thomas C. Daleen Squibb, MD, [facility]    TCW/MedQ  DD:  01/31/2006  DT:  02/01/2006  Job #:  [id number]   cc:   Gretta Arab. Valentina Lucks, M.D.

## 2010-10-15 NOTE — Cardiovascular Report (Signed)
[place]. [facility]  Patient:    Sophia Schroeder, Sophia Schroeder Visit Number: [id number] MRN: [id number]          Service Type: MED Location: CCUB 2903 01 Attending Physician:  Mirian Mo Dictated by:   Everardo Beals Juanda Chance, M.D. [facility] Proc. Date: 10/05/01 Admit Date:  10/05/2001   CC:         Thomas C. Wall, M.D. [facility]  Cardiopulmonary Laboratory   Cardiac Catheterization  PROCEDURES PERFORMED: Cardiac catheterization and Swan-Ganz placement.  CLINICAL HISTORY: The patient is 70 years old and has no prior history of known heart disease. She had the onset of chest pain about 8:30 this evening and came to [facility] with an ECG showing an anterior wall infarction. Her pain eased off after heparin and aspirin, but her ST segments remained markedly elevated and she was seen by Dr. Daleen Squibb and arrangements were made to take her to the catheterization laboratory. Her chest x-ray in the emergency room showed congestive heart failure.  DESCRIPTION OF PROCEDURE: The procedure was performed via the right femoral artery using an arterial sheath and 6 French preformed coronary catheters.  A front wall arterial puncture was performed and Omnipaque contrast was used. The patients initial ACT was greater than 200 seconds and she was given Integrilin bolus in infusion beginning just before the femoral artery puncture.  After completion of the diagnostic study, the patient became hypoxic. We were uncertain whether this related to pulmonary congestion or to the Versed that she received. We gave her 40 mg of Lasix and inserted a Foley catheter. Her LVEDP was initially 40. By the time we put the Swan-Ganz catheter in, her wedge had come down to 13 and her saturations had come back up in the 90s. Our impression was that this was probably congestive heart failure that improved with diuresis.  After reviewing the films, we felt that percutaneous intervention on the LAD, which  was diffusely diseased, and had TIMI-3 flow would be extremely difficulty and risky. We elected to stabilize the patient medically and then consider her for surgery later. We debated putting a balloon pump in but her hemodynamics improved so much that we elected not to do this. She left the lab in stable but serious condition.  RESULTS: Left main coronary artery: The left main coronary artery was a very short vessel that was free of significant disease.  Left anterior descending: The left anterior descending artery gave rise to four septal perforators and two diagonal branches. There was a 70% proximal stenosis. There was diffuse disease in the mid vessel with 99% and 95% focal stenoses before and after the second diagonal branch. The flow was TIMI-3. The second diagonal branch was a very small caliber vessel.  Circumflex artery: The circumflex artery gave rise to three posterolateral branches.  There was 80% narrowing in the first posterolateral and 95% narrowing in the second posterolateral branch. The circumflex artery also gave rise to a first marginal branch which had no major obstruction.  Right coronary artery: The right coronary was a dominant vessel that gave rise to a right ventricular branch, posterior descending branch, and three posterolateral branches. There was 70% narrowing in the mid right coronary artery. There was 90% ostial narrowing in the posterior descending branch. There was 50% narrowing in the distal right coronary artery.  LEFT VENTRICULOGRAPHY: The left ventriculogram performed in the RAO projection showed akinesis of the anterolateral wall and apex. The inferoapical segment was hypokinetic. The estimated ejection fraction  was 25-30%. There was 1-2+ mitral regurgitation.  DISTAL AORTOGRAM: A distal aortogram showed patent renal arteries and no major aortoiliac obstruction.  HEMODYNAMIC DATA: The right atrial pressure was 7 mean. The right  ventricular pressure was 27/7. The pulmonary artery pressure was 27/12 with a mean of 19. Pulmonary artery wedge pressure was 13 (after 800 cc of diuresis). The left ventricular pressure was 159/38 and the aortic pressure was 159/86. The cardiac output/cardiac index by Fick was 2.6/1.4 L/min. per sq m.  CONCLUSIONS: Acute anterior wall myocardial infarction complicated by congestive heart failure with 99% stenosis in the proximal to mid left anterior descending with diffuse disease, and TIMI-3 flow, 80 and 90% stenosis in the first and second posterolateral branch of the circumflex artery, 70% stenosis in the mid right coronary artery with 90% in the posterior descending branch and anterolateral wall and apical wall akinesis and inferoapical wall hypokinesis with an ejection fraction of 25-30%.  RECOMMENDATIONS: The patients coronary anatomy is very unfavorable for percutaneous intervention. We will plan to stabilize the patient with medical therapy with ACE inhibitor, beta blockers, heparin and Integrilin. If she has recurrent pain or hemodynamic compromise, then we will recommend placement of an intra-aortic balloon pump. Hopefully, we will be able to stabilize her condition medically, and hopefully she will have some improvement in left ventricular function and be a candidate for bypass surgery. We have CVTS group to see her with [place] and follow her with [place]. Dictated by:   Everardo Beals Juanda Chance, M.D. [facility] Attending Physician:  Mirian Mo DD:  10/06/01 TD:  10/09/01 Job: [id number]

## 2010-10-15 NOTE — H&P (Signed)
NAME:  Sophia Schroeder, Sophia Schroeder                           ACCOUNT NO.:  [id number]   MEDICAL RECORD NO.:  [id number]                   PATIENT TYPE:  INP   LOCATION:  0457                                 FACILITY:  [facility]   PHYSICIAN:  Ollen Gross, M.D.                 DATE OF BIRTH:  Oct 09, 1940   DATE OF ADMISSION:  06/03/2002  DATE OF DISCHARGE:                                HISTORY & PHYSICAL   CHIEF COMPLAINT:  Left hip pain.   HISTORY OF PRESENT ILLNESS:  The patient is a 70 year old female well known  to Ollen Gross, M.D. having a known history of bilateral hip avascular  necrosis.  She has previously undergone a right total hip replacement  arthroplasty back in October of 2003.  She has done extremely well with  respects to her right hip.  She does have a known significant history of  some hypertension, coronary arterial disease, history of myocardial  infarction back in May of 2003 where she also underwent coronary artery  bypass grafting following her heart attack.  She did undergo previous right  total hip replacement and has done quite well from a mobility standpoint  with respects to the right hip.  Now she presents to also have the left hip  done.  The patient states that she has contacted [employer]. Wall, M.D. [facility]  and has been approved for her up and coming surgery.  Risks and benefits of  this surgery have been discussed with the patient at length and she has  elected to proceed.  This patient is subsequently admitted to the hospital.   ALLERGIES:  No known drug allergies.   CURRENT MEDICATIONS:  1. Aspirin daily.  Stopped prior to surgery.  2. Zocor 40 mg daily.  3. Mavik 4 mg daily.  4. Coreg 25 mg one-half tablet b.i.d.   PAST MEDICAL HISTORY:  1. Hypertension.  2. Coronary arterial disease.  3. Hypercholesterolemia.  4. Myocardial infarction on Oct 05, 2001.  5. History of hepatitis 30 years ago.  6. History of avascular necrosis.  7. Postmenopausal.   PAST  SURGICAL HISTORY:  1. Tubal ligation approximately 30 years ago.  2. Nasal surgery approximately 20 years ago.  3. Coronary arterial bypass grafting Oct 10, 2001.  4. Right total hip replacement arthroplasty March 11, 2002.   SOCIAL HISTORY:  She is married.  Works as a Chemical engineer.  Nonsmoker.  No alcohol.  Has four children.  Two-story home.  The main  living area is on the lower level.  She does have six or seven steps  entering her house.   FAMILY HISTORY:  Father deceased age 90 with a history of heart disease and  myocardial infarction.  Mother deceased age 69 with a history of stroke and  arthritis.   REVIEW OF SYSTEMS:  GENERAL:  No fevers, chills, night sweats.  NEUROLOGIC:  No seizures, syncope, paralysis.  RESPIRATORY:  No shortness of breath,  productive cough, or hemoptysis.  CARDIOVASCULAR:  Significant history of  coronary arterial disease and hypertension.  She has not had any recent  chest pain, angina, orthopnea.  GASTROINTESTINAL:  History of hepatitis.  No  nausea, vomiting, diarrhea, constipation.  No blood or mucus in the stool.  GENITOURINARY:  No dysuria, hematuria, discharge.  MUSCULOSKELETAL:  Pertinent to that found in the left hip found in the history of present  illness.   PHYSICAL EXAMINATION:  VITAL SIGNS:  Pulse 60, respirations 12, blood  pressure 130/68.  GENERAL:  The patient is a 70 year old white female well-nourished, well-  developed, appears to be in no acute distress.  She is alert, oriented,  cooperative, extremely pleasant at time of the examination.  She is noted to  be ambulating with the assistance of a walker.  HEENT:  Normocephalic, atraumatic.  Pupils round, reactive.  Oropharynx  clear.  EOMs are intact.  NECK:  Supple.  CHEST:  Clear to auscultation anterior/posterior chest walls.  No rhonchi,  rales, or wheezing is noted.  HEART:  Regular rate and rhythm.  No murmurs.  S1, S2 is noted.  ABDOMEN:  Soft,  nontender.  Bowel sounds are present.  RECTAL:  Not done.  Not pertinent to present illness.  BREASTS:  Not done.  Not pertinent to present illness.  GENITALIA:  Not done.  Not pertinent to present illness.  EXTREMITIES:  Left lower extremity:  The left lower leg has a limited range  of motion.  The left hip will only flex to approximately 70 degrees.  There  is no internal or external rotation.  She is unable to abduct her leg.  She  is noted to have a flexion contracture.   LABORATORIES:  X-rays show destructive avascular necrosis changes throughout  the left hip with flattening and erosive destruction of the femoral head.   IMPRESSION:  1. Left hip avascular necrosis.  2. Status post right total hip replacement arthroplasty secondary to     avascular necrosis.  3. Hypertension.  4. Coronary arterial disease.  5. Hypercholesterolemia.  6. History of myocardial infarction on Oct 05, 2001.  7. Status post coronary arterial bypass grafting on Oct 10, 2001.  8. History of hepatitis.  9. Postmenopausal.   PLAN:  The patient will be admitted to [facility] to undergo a  left total hip replacement arthroplasty.  Surgery will be performed by Ollen Gross, M.D.  Thomas C. Wall, M.D. [facility], her cardiologist, will be notified  of the room number, admission, be consulted if needed for any medical  assistance with this patient throughout the hospital course.     Alexzandrew L. Julien Girt, P.A.              Ollen Gross, M.D.    ALP/MEDQ  D:  06/04/2002  T:  06/04/2002  Job:  [id number]   cc:   Ollen Gross, M.D.  [address]  [place]  [place] [address]  Fax: [phone]   Jesse Sans. Wall, M.D. [facility]  [address]  [place]  [place] [address]  Fax: 1   Evelene Croon, M.D.  [address]  [place]  [place] [address]  Fax: [phone]

## 2010-10-15 NOTE — Discharge Summary (Signed)
[place]. [facility]  Patient:    Sophia Schroeder, Sophia Schroeder Visit Number: [id number] MRN: [id number]          Service Type: MED Location: 2000 2008 01 Attending Physician:  Cleatrice Burke Dictated by:   Dominica Severin, P.A. Admit Date:  10/05/2001 Disc. Date: 10/16/01   CC:         CVTS office  Thomas C. Daleen Squibb, M.D. [facility]  Consuella Lose C. Valentina Lucks, M.D.   Discharge Summary  PRIMARY ADMISSION DIAGNOSIS:  Chest pain, rule out myocardial infarction.  SECONDARY DIAGNOSES/PAST MEDICAL HISTORY: 1. Hypertension. 2. History of hiatal hernia with gastroesophageal reflux. 3. History of hepatitis 30 years ago. 4. History of tubal ligation.  NEW DIAGNOSES/DISCHARGE DIAGNOSES: 1. Acute myocardial infarction with large anterolateral infarct, status post    coronary artery bypass graft surgery x6. 2. Congestive heart failure with an ejection fraction of 25%. 3. Postoperative hypotension. 4. Right internal carotid artery stenosis at 40-60%. 5. Left internal carotid artery stenosis at 60-80%. 6. Postoperative hypotension, resolved. 7. Postoperative prolonged pneumothorax, resolved.  PROCEDURES: 1. Cardiac catheterization done on Oct 05, 2001. 2. Pre-coronary artery bypass graft surgery, arterial evaluation done    on Oct 08, 2001. 3. Coronary artery bypass graft surgery x6, done on Oct 10, 2001, with    the following grafts placed:  Left internal mammary artery to the    left anterior descending artery, saphenous vein grafts to the diagonal    2 branch, sequential saphenous vein graft to the diagonal 1 and obtuse    marginal branches, sequential saphenous vein graft to posterior    descending and posterolateral branch.  HOSPITAL COURSE:   The patient is a 70 year old Caucasian female who was admitted on Oct 05, 2001, with an acute anterolateral myocardial infarction. She received sublingual nitroglycerin and aspirin in the field and was almost pain free upon arrival to the  hospital.  Immediate cardiac catheterization was performed, which showed severe three-vessel disease.  The patient had remained hemodynamically stable and remained free of chest pain and shortness of breath.  After being seen by Dr. Laneta Simmers in consultation, it was determined that the patient would benefit from coronary artery bypass graft surgery.  The patient agreed and proceeded with the surgery stated above on Oct 10, 2001. She tolerated the procedure well.  It was found that she had small diffusely diseased vessel and as well as a large anterolateral infarct.  Later that evening, she was extubated.  Neurologically, she was intact.  Her dopamine was weaned off.  She did have a temperature of 101.0.  Nevertheless, she remained stable on postoperative day #1.  It was planned, with her large anterolateral infarct, to place the patient on Lovenox and Coumadin, given the extensive infarct seen at surgery, to decrease the risk of intracardiac thrombus and embolism.  Her mediastinal tubes were discontinued on postoperative day #1, as well as her left pleural tube.  She was transferred to unit 2000 on postoperative day #2.  On postoperative day #1, it was found that she still had a 40% right pneumothorax.  The chest tube was in adequate position, although it was removed and another #20 French chest tube was inserted.  The patient tolerated that procedure well.  She was seen ambulating daily by cardiac rehabilitation phase 1.  She remained hemodynamically stable, although she did have some postoperative hypotension requiring a beta blocker and/or the [facility] which was started on her, to be held.  She was continued on Lovenox until her  INR was greater than 2.0.  She remained stable from a pulmonary standpoint, although on May 17, we did water-seal her chest tube and on the 18th, she still had an apical pneumothorax on her chest x-ray and her chest tube was left to water-seal and discontinued on Oct 15, 2001.  She tolerated that procedure well.  The patient was also started on Zocor as she was on it preoperatively.  Her followup chest x-ray, after her chest tube removal, did show a small apical pneumothorax.  Nevertheless, her INR was up to 1.8.  She was continued on her Lovenox.  Again with her INR being 1.8 on May 19, it was felt that the patients INR would possibly be therapeutic on May 20, and she is anticipated for discharge in the morning, on the 20th, as long as her INR is therapeutic, and her followup chest x-ray is okay.  CONDITION ON DISCHARGE:  Stable.  DISCHARGE MEDICATIONS: 1. Coated aspirin 81 mg. 2. Tylox 1-2 tablets every 4-6 hours as needed for pain. 3. Toprol XL 25 mg 1 daily. 4. Coumadin 5 mg tablets take as directed by Dr. Daleen Squibb. 5. Zocor 40 mg 1 a day. 6. Mavik 1 mg daily.  ACTIVITY/FOLLOWUP:  The patient is instructed not to do any driving or lifting more than 10 pounds.  She is to walk daily and continue her breathing exercises.  She is to follow a low fat, low sodium diet.  She is told she can shower, wash her incisions with mild soap and water, and call the office if any wound problems arise as noted on the fax sheet.  The patient was given a cardiac surgery fax sheet.  She is to obtain her blood work on Oct 18, 2001, at Dr. Anola Gurney office, and she is to see Dr. Daleen Squibb in two weeks to have a chest x-ray taken.  She will bring that chest x-ray and follow up with Dr. Laneta Simmers in three weeks at our office. Dictated by:   Dominica Severin, P.A. Attending Physician:  Cleatrice Burke DD:  10/15/01 TD:  10/17/01 Job: [id number]

## 2010-10-15 NOTE — Op Note (Signed)
NAME:  Sophia Schroeder, Sophia Schroeder                           ACCOUNT NO.:  [id number]   MEDICAL RECORD NO.:  [id number]                   PATIENT TYPE:  INP   LOCATION:  Z610                                 FACILITY:  [facility]   PHYSICIAN:  Ollen Gross, MD                   DATE OF BIRTH:  22-Jun-1940   DATE OF PROCEDURE:  03/11/2002  DATE OF DISCHARGE:                                 OPERATIVE REPORT   PREOPERATIVE DIAGNOSIS:  Osteoarthritis/avascular necrosis, right hip.   POSTOPERATIVE DIAGNOSIS:  Osteoarthritis/avascular necrosis, right hip.   PROCEDURE:  Right total hip arthroplasty.   SURGEON:  Gus Rankin. Aluisio, M.D.   ASSISTANT:  Alexzandrew L. Julien Girt, P.A.   ANESTHESIA:  General.   ESTIMATED BLOOD LOSS:  600.   DRAIN:  Hemovac x 1.   COMPLICATIONS:  None.   CONDITION:  Stable to recovery.   BRIEF CLINICAL NOTE:  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  She has essentially fused  hips.  She has severe pain.  She presents now for right total hip  arthroplasty after failure of nonoperative management.   PROCEDURE IN DETAIL:  After the successful administration of general  anesthetic, the patient is placed in the left lateral decubitus position  with the right side up and held with the hip positioner.  The right lower  extremity is isolated from her perineum with plastic drapes and prepped and  draped in the usual sterile fashion.  A mini posterolateral incision is made  with a 10 blade through subcutaneous tissue to the level of fascia lata  which is incised in line with the skin incision.  Sciatic nerve is palpated  and protected and the short external rotators isolated off the femur.  Capsulectomy is performed.  The hip is dislocated.  The femoral head is  significantly flattened and sclerotic.  We marked the center of the femoral  head and placed  a trial prosthesis so that the center of the trial head  corresponds with the center of her native femoral head.  The osteotomy line  is then made with an oscillating saw.  The femoral head is removed.  The  femur is then retracted anteriorly and acetabular exposure obtained.  Osteophytes are removed from the acetabulum and reaming initiated.  We  started at a 45, coursing in increments 2 to 51, and a 52 mm Pinnacle  acetabular shell is placed in anatomic position.  It is transfixed with dome  screw, excellent purchase.  We placed a trial neutral plus 4, 28 mm liner.  I did this because we medialized the cup to get to normal bone.  The femur  is then prepared, first with the canal finder and starter reamer.  The canal  is then irrigated and axial remaining  performed to up to 13.5 mm.  Proximal  reaming is performed up to 18D and the sleeve machined to a small.  An 18D  small sleeve is placed, matching her right normal anteversion.  The 18 x 13  stem with a 36 plus 8 neck is placed and a 28 plus 3 head.  The hip is  reduced and has great stability, full extension, full external rotation, 70  degrees flexion, 40 degrees adduction, and 90 degrees internal rotation;  then 90 degrees flexion, 70 degrees internal rotation.  We are very pleased  with the stability, and thus the trials are removed.  The apex hole  eliminator is placed into the acetabular shell.  The permanent 28 mm neutral  plus 4 Marathon liner is placed.  In the femoral side, the 18D small sleeve  is placed, and then the 18 x 13 stem, 36 plus 8 neck placed matching her  native anteversion.  The 28 plus 3 permanent head is placed.  The hip is  then reduced with the same stability parameters.  The wound is copiously  irrigated with antibiotic solution, and the short external rotators are  reattached to the femur through drill holes.  The fascia lata is closed over  a Hemovac drain with interrupted #1 Vicryl, subcu closed with #1 and  2-0  Vicryl and subcuticular running 4-0 Monocryl.  Incision is clean and dry and  Steri-Strips and a bulky sterile dressing applied.  Drain is hooked to  suction.  The patient is awakened and transported to recovery in stable  condition.                                               Ollen Gross, MD    FA/MEDQ  D:  03/11/2002  T:  03/12/2002  Job:  [id number]

## 2010-10-15 NOTE — H&P (Signed)
[place]. [facility]  Patient:    Sophia Schroeder, Sophia Schroeder Visit Number: [id number] MRN: [id number]          Service Type: MED Location: [facility] 2922 01 Attending Physician:  Mirian Mo Dictated by:   Jesse Sans Wall, M.D. [facility] Admit Date:  10/05/2001   CC:         Sophia Arab. Sophia Schroeder, M.D.   History and Physical  CHIEF COMPLAINT:  Chest tightness and clamminess tonight while eating pizza and pickles.  HISTORY OF PRESENT ILLNESS:  Sophia Schroeder is a 70 year old married white female from [place] with no previous cardiac history.  She experienced substernal chest discomfort about 8:45 p.m. at home while eating pizza and pickles.  She described this is an ache in the center of her chest and developed a clammy sensation.  She thought this was her hiatal hernia.  Her husband thought differently.  He called 911.  EKG in the field at 9:10 p.m. demonstrated an acute anterolateral infarct with ST segment reciprocal changes inferiorly.  She was given four baby aspirin and two sublingual nitroglycerins en route. By the time she arrived at [facility] ER, she was essentially pain-free.  EKG showed persistent ST segment elevation x2 and the cath lab was called.  PAST MEDICAL HISTORY:  Her past medical history is significant for hypertension for the last few years.  She has no other cardiac risk factors except age.  She does not know her lipid status.  She has a history of hiatal hernia with gastroesophageal reflux.  ALLERGIES:  She has no known drug allergies.  MEDICATIONS: 1. Atenolol 50 mg a day. 2. Diclofenac extended release 100 mg p.o. b.i.d. 3. Ambien 5 mg p.o. p.r.n. 4. Darvocet p.r.n. 5. She has been on Zoloft in the past but is not currently taking.  PAST SURGICAL HISTORY:  She has had her tubes tied, otherwise, no significant surgery.  SOCIAL HISTORY:  She lives in [place].  She is married.  She has a daughter who I have met  tonight.  REVIEW OF SYSTEMS:  Other than the above history of present illness, noncontributory.  She has no bleeding and no bleeding diathesis.  PHYSICAL EXAMINATION:  GENERAL:  Her exam showed her to be in no acute distress.  Her skin is warm and dry.  VITAL SIGNS:  Her blood pressure is 150/78.  Her pulse is 74 and regular.  She is in sinus rhythm.  HEENT:  Exam reveals sclerae to be clear.  Extraocular movements are intact. Pupils are equal and responsive to light and accommodation.  Facial symmetry is normal.  NECK:  No JVD.  Carotid upstrokes are equal bilaterally without bruits.  There is no thyromegaly.  Trachea is midline.  There is no lymphadenopathy.  LUNGS:  Clear to auscultation and percussion except for a few crackles in the distal bases.  CARDIAC:  Exam reveals a poorly appreciated PMI.  There is a normal S1 and S2 with physiological splitting.  There is no murmur or gallop or rub.  ABDOMEN:  Obese, making organomegaly difficult to assess.  There is no obvious hepatomegaly.  There is no midline bruit.  Femoral pulses were present bilaterally.  EXTREMITIES:  Extremities reveal trace edema with dorsalis pedis and posterior tibial pulses being 2+/4+ bilaterally.  NEUROLOGIC:  Exam is grossly intact.  LABORATORY AND ACCESSORY DATA:  Laboratory data is pending.  Chest x-ray shows normal cardiac silhouette with vascular congestion.  ASSESSMENT: 1. Acute anterolateral myocardial infarction, symptom  onset about 8:45 p.m.    The patient is currently pain-free, though has persistent ST segment    elevation quite worrisome for a high-grade lesion with suboptimal TIMI    blood flow. 2. Hypertension. 3. Gastroesophageal reflux with hiatal hernia. 4. History of degenerative arthritis.  PLAN:  Emergency catheterization with probable PCI with Dr. Everardo Beals. [place]. The cath lab was called at 10 p.m.  Denies Muncey, Consulting civil engineer, was called at the same  time.  Indications, risks and potential benefits have been discussed with patient, daughter and husband.  They agree to proceed. Dictated by:   Jesse Sans Wall, M.D. [facility] Attending Physician:  Mirian Mo DD:  10/05/01 TD:  10/08/01 Job: [id number]

## 2010-10-15 NOTE — Discharge Summary (Signed)
NAME:  Sophia Schroeder, Sophia Schroeder                           ACCOUNT NO.:  [id number]   MEDICAL RECORD NO.:  [id number]                   PATIENT TYPE:  INP   LOCATION:  0457                                 FACILITY:  [facility]   PHYSICIAN:  Ollen Gross, M.D.                 DATE OF BIRTH:  1940-07-29   DATE OF ADMISSION:  06/03/2002  DATE OF DISCHARGE:  06/07/2002                                 DISCHARGE SUMMARY   ADMITTING DIAGNOSES:  1. Left hip avascular necrosis.  2. Status post right total hip replacement arthroplasty secondary to     avascular necrosis.  3. Hypertension.  4. Coronary arterial disease.  5. Hypercholesterolemia.  6. History of myocardial infarction May 2003.  7. Status post coronary artery bypass grafting Oct 10, 2001.  8. History of hepatitis.  9. Postmenopausal.   DISCHARGE DIAGNOSES:  1. Avascular necrosis left hip status post left total hip replacement     arthroplasty.  2. Status post right total hip replacement arthroplasty secondary to     avascular necrosis.  3. Hypertension.  4. Coronary arterial disease.  5. Hypercholesterolemia.  6. History of myocardial infarction May 2003.  7. Status post coronary artery bypass grafting Oct 10, 2001.  8. History of hepatitis.  9. Postmenopausal.  10.      Postoperative blood loss anemia.  11.      Status post transfusion without sequelae.  12.      Hypokalemia, improved.   PROCEDURE:  The patient was taken to the OR on June 03, 2002.  Underwent a  left total hip replacement arthroplasty.  Surgeon Ollen Gross, M.D.  Assistant Alexzandrew L. Julien Girt, P.A.  Surgery under general anesthesia.  Estimated blood loss 500 mL.  Hemovac drain x1.   CONSULTS:  None.   BRIEF HISTORY:  The patient is an 70 year old female well known to Ollen Gross, M.D. having history of bilateral hip avascular necrosis.  She  previously underwent a right total hip replacement arthroplasty back in  October of 2003.  She has done  extremely well with regards to her right hip.  The left hip has continued to cause her problems and she is ready to proceed  with surgical intervention.  She has been seen by Maisie Fus C. Wall, M.D. [facility]  in the past and felt she was stable from a cardiac standpoint to undergo the  surgery.  Risks and benefits discussed and she was subsequently admitted to  the hospital.   LABORATORY DATA:  CBC on admission showed a hemoglobin of 12.4, hematocrit  of 36.2, white cell count 5.4, red cell count 3.90, differential all within  normal limits.  Postoperative H&H 9.3 and 27.0, continued to drop down to  8.0, 23.1.  Given 2 units of blood.  Post transfusion hemoglobin and  hematocrit 11.6 and 32.9.  PT/PTT on admission were 12.2 and 25,  respectively with an INR  of 0.8.  Serial pro times followed per Coumadin  protocol.  Last noted PT/INR 19.5 and 1.8.  Chemistry panel on admission all  within normal limits.  Serial BMETs were followed.  The patient did have a  drop in potassium from 4.1 down to 3.4.  Potassium came back up to 4.1.  Calcium dropped from 9.6 to 8.1.  Urinalysis preoperative showed moderate  blood, moderate leukocyte esterase with many epithelial cells, but only 3-6  white cells and 0-2 red cells, few bacteria.  Blood group type A+.   EKG found on chart dated June 05, 2002:  Normal sinus rhythm, low voltage  QRS, T-wave abnormalities.  Consider lateral ischemia.  This is an  unconfirmed EKG.  Pelvis film dated May 28, 2002:  Stable end-stage  osteonecrosis left femoral head with secondary degenerative changes.  Two  views of the left hip:  Stable end-stage osteonecrosis left femoral head.  Postoperative hip and pelvis films show left total hip replacement  arthroplasty with well seated acetabular and femoral components and no  complicating features.   HOSPITAL COURSE:  The patient was admitted to [facility], taken to  the OR, underwent above stated procedure without  complications.  The patient  tolerated procedure well.  Was allowed to return to recovery room and then  to the orthopedic floor for continued postoperative care.  Vital signs were  followed.  I's and O's were followed.  She did have some positive fluid  balance following surgery.  She underwent mild diuresis and the I's and O's  improved.  She was also noted to have a drop in potassium down to 3.4.  She  was given potassium supplements and a potassium level came right back up.  Hemovac drain placed at time of surgery was pulled on postoperative day one.  Dressing change initiated on postoperative day two.  Her small postoperative  incision was healing well.  PT and OT were consulted postoperatively to  assist with gait training ambulation and ADLs.  The patient did well with  her pain control following surgery.  She was initially placed on PC  analgesics and weaned over to p.o. medications.  She started off slowly,  only ambulating approximately 5 feet on postoperative day one but greatly  improved over the next couple of days and was ambulating approximately 200  feet by postoperative day three with a rolling walker and only minimal  assist and supervision.  She did so well with her physical therapy and was  already taking p.o. medications.  She was able to be discharged home on  postoperative day four after she had been seen in rounds.  It was noted on  postoperative day two also that her hemoglobin had dropped down to 8.0.  At  that time she was transfused with 2 units of blood.  She did tolerate the  blood quite well and her hemoglobin came back to 11.6.  She was  hemodynamically stable and on June 07, 2002 it was decided that she would  be discharged home.   DISCHARGE PLAN:  The patient discharged home on June 07, 2002.   DISCHARGE DIAGNOSES:  Please see above.   DISCHARGE MEDICATIONS:  1. Trinsicon b.i.d.  2. Coumadin for DVT prophylaxis. 3. Percocet for pain.  4. Robaxin  for spasm.   DIET:  Low cholesterol cardiac diet.   ACTIVITY:  Touchdown weightbearing to the left lower extremity.  Home health  PT, home health nursing through [facility].  Hip precautions at all  times.  She actually may discontinue the dressing change at time of  discharge and may start showering at the time of discharge.   FOLLOW UP:  Follow up two weeks from date of surgery.  Call the office for  an appointment.    DISPOSITION:  Home.   CONDITION ON DISCHARGE:  Improved.     Alexzandrew L. Julien Girt, P.A.              Ollen Gross, M.D.    ALP/MEDQ  D:  07/16/2002  T:  07/16/2002  Job:  [id number]   cc:   Ollen Gross, M.D.  [address]  [place]  [place] [address]  Fax: [phone]   Jesse Sans. Daleen Squibb, M.D. [facility]   Evelene Croon, M.D.  [address]  [place]  [place] [address]  Fax: [phone]

## 2010-10-15 NOTE — Discharge Summary (Signed)
NAME:  Sophia Schroeder, Sophia Schroeder                           ACCOUNT NO.:  [id number]   MEDICAL RECORD NO.:  [id number]                   PATIENT TYPE:  INP   LOCATION:  0372                                 FACILITY:  [facility]   PHYSICIAN:  Gus Rankin. Aluisio, M.D.              DATE OF BIRTH:  1940-06-23   DATE OF ADMISSION:  03/11/2002  DATE OF DISCHARGE:  03/16/2002                                 DISCHARGE SUMMARY   ADMISSION DIAGNOSES:  1. Bilateral hip avascular necrosis.  2. Hypertension.  3. Coronary artery disease.  4. Hypercholesterolemia.  5. History of myocardial infarction on Oct 05, 2001.  6. Status post coronary artery bypass grafting on Oct 10, 2001.  7. History of hepatitis.  8. Post menopausal.   DISCHARGE DIAGNOSES:  1. Osteoarthritis avascular necrosis right hip, status post right total hip     replacement arthroplasty.  2. Osteoarthritis avascular necrosis left hip.  3. Postoperative hypotension.  4. Mild blood loss anemia.  5. Coronary artery disease.  6. History of myocardial infarction on Oct 05, 2001.  7. Status post coronary artery bypass grafting on Oct 10, 2001.  8. History of hepatitis.  9. Post menopausal.  10.      Hypercholesterolemia.  11.      Status post transfusion without sequela.  12.      Postoperative hypokalemia, improved.   PROCEDURE:  The patient was taken to the OR on March 11, 2002 and  underwent a right total hip replacement arthroplasty.   SURGEON:  Ollen Gross, M.D.   ASSISTANT:  Patrica Duel, P.A.C.   ANESTHESIA:  General.   DRAINS:  Hemovac drain times one.   HISTORY OF PRESENT ILLNESS:  The patient is a 70 year old female who has  been seen by Dr. Lequita Halt for bilateral hip pain. She has a known history of  bilateral avascular necrosis. She was seen and treated in the past and  recommended that she have total hip replacements. She was scheduled for  surgery earlier in the year but developed some cardiac symptoms and was  found to have a myocardial infarction back in May of this year. She ended up  undergoing six vessel bypass grafting by Dr. Laneta Simmers and was recently  followed up by Dr. Daleen Squibb and has been cleared to proceed with hip surgery.  She was seen in consultation by Dr. Lequita Halt and underwent preoperative  evaluation. Risks and benefits were discussed and she has elected to proceed  with surgery.   CONSULTATIONS:  Cardiology Services, Dr. Daleen [facility].   LABORATORY DATA:  CBC on admission showed a hemoglobin and hematocrit of  11.6 and 34.3. WBC count 5.5, red blood cells count 3.77. Diff showed  elevated neutrophils at 41, elevated lymphs at 51, monos 7, eosinophils 1,  and basophils 0. Serial hemoglobin and hematocrits were followed during the  hospital course. Hemoglobin dropped down to a level of 8.0  and 22.9. She was  given blood. Post hemoglobin was 9.6. Last known hemoglobin and hematocrit  was 9.4 and 27.5. PT and PTT on admission were 13.0 and 29 respectively with  INR of 0.9. Serial protimes followed per Coumadin protocol. Last noted  PT/INR 19.9 and 1.9. Chem panel on admission all within normal limits with  the exception of elevated total bilirubin of 1.6. Serial B-mets were  followed. Potassium did drop from 4.3 to 3.3 and then 3.0 and was back up to  3.6. Calcium dropped from 9.5 to 7.5 and back up to 7.7. CK enzymes were  taken on March 11, 2002. CK followed March 11, 2002 until March 12, 2002. Initial CK elevated at 562 and then 758 and then 773. CK MB's 8.2 then  7.2 then 5.1. Relative indexes were normal at 1.5, 0.9, and 0.7. Troponin I  was 0.08 and 0.10 and 0.05. UA on admission revealed small leukocyte  esterase with few epithelial cells, 0-2 white cells, 0-2 red cells. Few  bacteria. Blood group type A+.   DIAGNOSTIC STUDIES:  EKG dates March 11, 2002 revealed normal sinus  rhythm, septal infarct, and lateral infarct, age indeterminate. Decreased  rate and  decreased ST elevations since previous EKG confirmed by Dr. Charlton Haws. Chest x-ray on March 05, 2002 revealed chronic obstructive  pulmonary disease with no active disease. Hip films on March 05, 2002  revealed bilateral severe arthritic changes of the hip secondary to  avascular necrosis of the femoral heads. Pelvic film on March 11, 2002  showed anatomic alignment, status post right total hip replacement  arthroplasty.   HOSPITAL COURSE:  The patient was admitted to [facility]  and taken to the OR and underwent the above stated procedure. The patient  did have slight drop in blood pressure during and shortly after the  procedure and she was transferred to the ICU for postoperative care. Hemovac  drain was placed at the time of surgery. Cardiology consult was called over  to Dr. Vern Claude office. She was seen in consult by Dr. Daleen [facility]. She  did have a drop in pressure. Cardiac enzymes were followed. Medications were  adjusted by Cardiology. Hemovac drain was pulled on postoperative day one.  It was felt that the troponin levels were not significant and the relative  indexes did remain within normal limits. She had a drop in potassium  postoperative. She was placed on K-Dur supplementation. Her pressure did  start to improve with adjusting of medications. Hypokalemia did resolve. By  day two, she was doing much better. She did have some pain around the hip  but no chest pain or shortness of breath. Hypokalemia did improve. However,  her hemoglobin had dropped further down to a level of 8.0. She was given two  units of blood. She responded well and her hemoglobin came back up to 9.6.  From a cardiac standpoint by day three, she was doing much better and was  stable. She started to progress with PT. She was initially ambulating only  10 feet by postoperative day two. However, this increased to 40 feet by postoperative day three and 50 feet by postoperative  day four. PCA and Foley  was discontinued by postoperative day three. By day four, she was doing much  better. She was back on her home medications. She was stable from a cardiac  standpoint. Therefore discharge planning started making arrangements for  home and on the following day, March 16, 2002, she was doing much better.  She was controlled on P.O. mediations and progressing well with therapy and  she was discharged home at that time.   DISPOSITION:  The patient was discharged to home on March 16, 2002.   DISCHARGE MEDICATIONS:  Percocet for pain and Robaxin for spasms. Continue  home medications. She is also on Coumadin protocol.   DIET:  Cardiac diet.   ACTIVITY:  Touch down weight bearing to the right lower extremity. Continue  with home PT through home care. Hip precautions.   FOLLOW UP:  Two weeks from surgery.   CONDITION ON DISCHARGE:  Improved.                      Alexzandrew L. Julien Girt, P.A.              Gus Rankin Aluisio, M.D.    ALP/MEDQ  D:  04/03/2002  T:  04/03/2002  Job:  [id number]

## 2010-10-15 NOTE — Op Note (Signed)
[place]. [facility]  Patient:    Sophia Schroeder, Sophia Schroeder Visit Number: [id number] MRN: [id number]          Service Type: MED Location: [id number] Attending Physician:  Cleatrice Burke Dictated by:   Alleen Borne, M.D. Proc. Date: 10/10/01 Admit Date:  10/05/2001   CC:         Thomas C. Wall, M.D. [facility]  Cath lab   Operative Report  PREOPERATIVE DIAGNOSIS:  Severe three-vessel coronary artery disease, status post acute anterolateral myocardial infarction.  POSTOPERATIVE DIAGNOSIS:  Severe three-vessel coronary artery disease, status post acute anterolateral myocardial infarction.  OPERATION PERFORMED:  Median sternotomy, extracorporeal circulation, coronary artery bypass graft surgery times six using a left internal mammary artery graft to the left anterior descending coronary artery, with a saphenous vein graft to the second diagonal branch of the left anterior descending, a sequential saphenous vein graft to the first diagonal branch of the left anterior descending and the obtuse marginal branch of the left circumflex coronary artery, and a sequential saphenous vein graft to the posterior descending and posterolateral branch of the right coronary artery.  SURGEON:  Alleen Borne, M.D.  ASSISTANT: 1. Salvatore Decent. Cornelius Moras, M.D. 2. Dominica Severin, P.A.  ANESTHESIA:  General endotracheal.  INDICATIONS FOR PROCEDURE:  The patient is a 70 year old woman who was admitted with an acute anterolateral myocardial infarction.  She received sublingual nitroglycerin and aspirin in the field and was almost pain free on arrival to the hospital by EMS.  Her CPK-MB went up to about 500.  Immediate cardiac catheterization was performed and this showed severe three-vessel disease.  The LAD had 70% proximal and then 99% and 95% proximal to midvessel stenoses.  There were two diagonal branches, the first of which was slightly larger.  The left circumflex had a single  large marginal branch that had 80% stenosis.  The right coronary artery had 70% midvessel stenosis and about 50% distal stenosis before the posterolateral branch.  The posterior descending branch had about 90% proximal stenosis.  Left ventricular ejection fraction was about 25 to 30% with anterolateral and apical akinesis.  The patient remained hemodynamically stable and remained free of chest pain and shortness of breath.  After review of the angiograms and examination of the patient it was felt that coronary artery bypass graft surgery was the best treatment.  I discussed the operative procedure with the patient and her husband, daughter and son, including alternatives to surgery, benefits, and risks including bleeding, possible blood transfusion, infection, stroke, myocardial infarction, and death.  They understood and agreed to proceed with surgery.  DESCRIPTION OF PROCEDURE:  The patient was taken to the operating room and placed on the table in supine position.  After induction of general endotracheal anesthesia, a Foley catheter was placed in the bladder using sterile technique.  Then the chest, abdomen and both lower extremities were prepped and draped in the usual sterile manner.  The chest was entered through a median sternotomy incision and the pericardium opened in the midline. Examination of the heart showed a large amount of epicardial fat.  There was an area of extensive infarction over the anterolateral wall.  There was a large amount of old bloody, pericardial effusion probably related to the myocardial infarction.  The ascending aorta had no palpable plaques in it.  Then the left internal mammary artery was harvested from the chest wall as a pedicle graft.  This was a medium caliber vessel with excellent blood  flow through it.  At the same time, a segment of greater saphenous vein was harvested from the left leg.  This vein was of medium caliber and good quality.   There were a few segmental areas throughout the vein where the vein did narrow down slightly in size though it was still felt to be suitable.  I initially examined the vein at the right ankle and this vein was of small caliber and not felt to be suitable.  Then the patient was heparinized and when an adequate activated clotting time was achieved, the distal ascending aorta was cannulated using a 20 French aortic cannula for arterial inflow.  Venous outflow was achieved using a two-stage venous cannula through the right atrial appendage.  An antegrade cardioplegia and vent cannula was inserted into the aortic root.  A retrograde cardioplegia cannula was inserted through the right atrium into the coronary sinus.  The patient was placed on cardiopulmonary bypass and the distal coronary arteries were identified.  Then the aorta was cross-clamped and 500 cc of cold blood antegrade cardioplegia was administered in the aortic root with quick arrest of the heart.  This was followed by 300 cc of cold blood cardioplegia retrograde cardioplegia.  Systemic hypothermia to 20 degrees centigrade and topical hypothermia with iced saline was used.  A temperature probe was placed in the septum and an insulating pad in the pericardium.  The first distal anastomosis was performed to the first diagonal branch.  The internal diameter was about 1.5 mm.  The conduit used was a segment of greater saphenous vein.  Anastomosis was performed in a sequential side-to-side manner using continuous 8-0 Prolene suture.  The flow was measured through the graft and was good.  The second distal anastomosis was then performed to the obtuse marginal branch.   The internal diameter was 1.75 mm.  The conduit used was the same segment of greater saphenous vein.  The anastomosis was performed in a sequential end-to-side manner using continuous 7-0 Prolene suture.  The flow  was measured through the graft and was excellent.   Then another dose of cardioplegia was given down the vein graft and into the aortic root.  The third distal anastomosis was performed to the posterior descending branch of the right coronary artery.  The internal diameter was about 1.6 mm.  The conduit used was a second segment of greater saphenous vein.  The anastomosis was performed in a sequential side-to-side manner using continuous 7-0 Prolene suture.  The flow was measured through the graft and was excellent.  The fourth distal anastomosis was performed to the posterolateral branch.  The internal diameter was about 1.6 mm.  The conduit used was the same segment of greater saphenous vein.  The anastomosis was performed in a sequential end-to-side manner using continuous 7-0 Prolene suture.  The flow was measured through the graft and was excellent.  Then another dose of cardioplegia was given down the vein grafts and in the aortic root.  The fifth distal anastomosis was performed to the second diagonal branch.  The internal diameter was about 1.5 mm.  The conduit used was a third segment of greater saphenous vein.  The anastomosis was performed in an end-to-side manner using continuous 7-0 Prolene suture.  The flow was measured through the graft and was excellent.  The sixth distal anastomosis was performed to the midportion of the left anterior descending coronary artery.  The internal diameter of this vessel was only about 1.6 mm.  The conduit used  was the left internal mammary artery graft and this was brought through an opening in the left pericardium anterior to the phrenic nerve.  It was anastomosed to the LAD in end-to-side manner using continuous 8-0 Prolene suture.  The pedicle was tacked to the epicardium with 6-0 Prolene sutures.  The patient was rewarmed to 37 degrees centigrade and the clamp removed from the mammary pedicle.  There was rapid warming of the ventricular septum and return of spontaneous ventricular  fibrillation.  The crossclamp was removed with a time of 87 minutes and the patient defibrillated into sinus rhythm.  A partial occlusion clamp was placed on the aortic root and the three proximal vein graft anastomoses were performed in end-to-side manner using continuous 6-0 suture.  The clamps were removed, the vein grafts deaired and the clamps removed from them.  The proximal and distal anastomoses appeared hemostatic and the line of the grafts satisfactory.  Graft markers were placed around the proximal anastomoses.  Two temporary right ventricular and right atrial pacing wires were placed and brought out through the skin.  When the patient had rewarmed to 37 degrees centigrade, she was weaned from cardiopulmonary bypass on low dose dopamine.  Total bypass time was 134 minutes.  Cardiac function appeared improved with a cardiac output of 5L per minute.  Protamine was given and the venous and aortic cannulas were removed without difficulty.  Hemostasis was achieved.  Three chest tubes were placed with a tube in the posterior pericardium and one in the left pleural space and one in the anterior mediastinum.  The pericardium was reapproximated over the heart.  The sternum was closed with #6 stainless steel wires.  The fascia was closed with continuous #1 Vicryl suture.  The subcutaneous tissues were closed using continuous 2-0 Vicryl and the skin with 3-0 Vicryl subcuticular closure. The lower extremity vein harvest site was closed in layers in a similar manner.  The sponge, needle and instrument counts were correct according to the scrub nurse.  Dry sterile dressings were applied over the incisions and around the chest tubes which were hooked to Pleur-evac suction.  The patient remained hemodynamically stable and was transported to the SICU in guarded but stable condition. Dictated by:   Alleen Borne, M.D. Attending Physician:  Cleatrice Burke DD:  10/10/01 TD:  10/10/01 Job:  [id number]

## 2010-10-15 NOTE — Op Note (Signed)
NAME:  Sophia Schroeder, Sophia Schroeder                           ACCOUNT NO.:  [id number]   MEDICAL RECORD NO.:  [id number]                   PATIENT TYPE:  INP   LOCATION:  X010                                 FACILITY:  [facility]   PHYSICIAN:  Ollen Gross, M.D.                 DATE OF BIRTH:  August 23, 1940   DATE OF PROCEDURE:  06/03/2002  DATE OF DISCHARGE:                                 OPERATIVE REPORT   PREOPERATIVE DIAGNOSES:  Avascular necrosis, left hip.   POSTOPERATIVE DIAGNOSES:  Avascular necrosis, left hip.   PROCEDURE:  Left total hip arthroplasty.   SURGEON:  Ollen Gross, M.D.   ASSISTANT:  Alexzandrew L. Julien Girt, P.A.   ANESTHESIA:  General.   ESTIMATED BLOOD LOSS:  500   DRAINS:  Hemovac x1.   COMPLICATIONS:  None.   CONDITION:  Stable to recovery.   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 She  presents now for left total hip arthroplasty secondary to intractable pain.   DESCRIPTION OF PROCEDURE:  After successful administration of general  anesthetic, the patient was placed in the right lateral decubitus position  with the left side up and held with the hip positioner. The left lower  extremity was isolated from her perineum with plastic drapes and prepped and  draped in the usual sterile fashion. A mini posterolateral incision was made  a 10 blade through the subcutaneous tissue to the level of the fascia lata  which was incised in line with the skin incision. The sciatic nerve was  palpated and protected and short rotators isolated off the femur.  Capsulectomy was performed and hip dislocated. The center of the femoral  head is marked and trial prosthesis placed such that the center of the trial  head corresponds to the center of her native femoral head. The osteotomy  line is marked on the femoral neck  and osteotomy made with an oscillating  saw. The femoral head is removed, femur retracted anteriorly and acetabular  exposure obtained.   Acetabular reaming is then performed starting at a 47 coursing in increments  of 2 to a 53 and then a 54 mm pinnacle acetabular shell is impacted into the  acetabulum in anatomic position and subsequently transfixed with two dome  screws. We then placed a trial 28 mm neutral liner.   The femur was then addressed first with the canal finder and canal was  irrigated. Axial reaming was performed to 13.5 mm, proximal reaming to 18D  and the sleeve machined to a large. An 18D large trial sleeve is placed with  an 18 x 13 stem, 36 plus 8 neck, 28 plus zero head. The anteversion matches  her native anteversion. The hip is reduced with excellent stability, full  extension, full external rotation, 70 degrees flexion and 40 degrees  adduction, 90 degrees internal rotation and then 90 degrees flexion and 70  degrees internal rotation. The trials were then removed and the permanent  apex hole eliminator and permanent 28 mm neutral marathon liner are placed  into the acetabular shell. Permanent 18D large sleeve and 18 x 13 stem with  a 36 plus 8 neck were impacted and again the anteversion of the stem matches  her native anteversion. A permanent 28 plus zero head is placed, hip reduced  with the same stability parameters. The wound was copiously irrigated with  antibiotic solution and short rotators reattached to the femur through drill  holes. The fascia lata was closed over the Hemovac drain with interrupted #1  Vicryl. The subcu closed with #1 and 2-0 Vicryl and subcuticular with  running 4-0 Monocryl. The incision was cleaned and dried and Steri-Strips  and a bulky sterile dressing applied. She was subsequently awakened and  transported to recovery in stable condition.                                               Ollen Gross, M.D.    FA/MEDQ  D:   06/03/2002  T:  06/03/2002  Job:  [id number]

## 2010-10-15 NOTE — H&P (Signed)
NAME:  QUERIDA, BERETTA                           ACCOUNT NO.:  [id number]   MEDICAL RECORD NO.:  [id number]                   PATIENT TYPE:  INP   LOCATION:  NA                                   [facility]ACILITY:  [facility]   PHYSICIAN:  Ollen Gross, MD                   DATE OF BIRTH:  [birth year]   DATE OF ADMISSION:  03/11/2002  DATE OF DISCHARGE:                                HISTORY & PHYSICAL   CHIEF COMPLAINT:  Bilateral hip pain.   HISTORY OF PRESENT ILLNESS:  The patient is a 70 year old female patient who  has recently been seen and evaluated by Dr. Ollen Gross for bilateral hip  pain.  The patient has a known history of bilateral avascular necrosis of  her hips.  She has been seen and treated in the past, and was told that she  would require total hip replacement arthroplasty.  She originally was going  to have her surgery scheduled a while back, however, developed some cardiac  symptoms, and was found to have a heart attack, myocardial infarction, back  in 5/03.  She did end up undergoing a six vessel coronary artery bypass  grafting by Dr. Laneta Simmers.  She has recently been followed up by Dr. Daleen Squibb, and  has been cleared to proceed with her surgery.  She is seen in consultation  by Dr. Ollen Gross, and found to have severe destructive changes in both  hips secondary to avascular necrosis.  It is felt she would benefit from  undergoing total hip replacement arthroplasties at this time.  The right hip  is more symptomatic, and she has elected to proceed with surgery.  Risks and  benefits have been discussed, and she has elected to be admitted to the  hospital for right total hip replacement arthroplasty.   ALLERGIES:  No known drug allergies.   CURRENT MEDICATIONS:  1. Hydrocodone 7.5 mg p.r.n. pain.  2. Coreg 25 mg 1/2 tablet p.o. b.i.d.  3. Mavik 4 mg one p.o. q.d.  4. Zocor 40 mg one p.o. q.d.  5. Ecotrin 81 mg p.o. q.d., stopped prior to surgery.   PAST MEDICAL HISTORY:  1. Hypertension.  2. Coronary arterial disease.  3. Hypercholesterolemia.  4. Myocardial infarction on 10/05/01.  5. History of hepatitis 30 years ago.  6. History of avascular necrosis.  7. Postmenopausal.   PAST SURGICAL HISTORY:  1. Tubal ligation 30 years ago.  2. Nasal surgery 20 years ago.  3. Coronary artery bypass grafting on 10/10/01.   SOCIAL HISTORY:  She is married.  She works as a Midwife.  She denies the use of alcohol products or tobacco products.  Has  four children.  Her sister will be assisting her with care.  She does have  six steps entering into her home.   FAMILY HISTORY:  Father deceased at age 73 with a  history of heart disease  and myocardial infarction.  Mother deceased at age 71 with a history of  stroke and arthritis.   REVIEW OF SYMPTOMS:  GENERAL:  No fevers, chills, night sweats.  NEURO:  No  seizures, syncope, or paralysis.  RESPIRATORY:  No shortness of breath,  productive cough, or hemoptysis.  CARDIOVASCULAR:  Significant history of  coronary artery disease and hypertension.  No chest pain, angina, or  orthopnea.  GASTROINTESTINAL:  History of hepatitis, no nausea, vomiting,  diarrhea, or constipation.  No blood or mucus in the stool.  GENITOURINARY:  No dysuria, hematuria, or discharge.  MUSCULOSKELETAL:  Pertinent of that of  both hips found in the history of present illness.   PHYSICAL EXAMINATION:  VITAL SIGNS:  Pulse 68, respirations 20, blood  pressure 152/76.  GENERAL:  The patient is a 70 year old white female, well-developed, well-  nourished, appears to be in no acute distress.  She is alert, oriented, and  cooperative, extremely pleasant at time of examination.  HEENT:  Normocephalic, atraumatic.  Pupils are round and reactive.  Oropharynx is clear.  NECK:  Supple.  CHEST:  Clear to auscultation, anterior and posterior chest walls.  HEART:  Regular rate and rhythm.  No murmurs.  S1 and S2 noted.  ABDOMEN:   Soft, nontender, bowel sounds are present.  RECTAL:  Not done, not pertinent to present illness.  BREASTS:  Not done, not pertinent to present illness.  GENITALIA:  Not done, not pertinent to present illness.  EXTREMITIES:  She does ambulate with a markedly antalgic gait, utilizing a  walker for stability.  She has limited flexion in both the right and left  hip.  She has minimal external rotation and 0 internal rotation of both the  right and left hip.  Motor function is intact.  She is noted to have  bilateral hip flexion contractures which does inhibit her posture.   IMPRESSION:  1. Bilateral hip avascular necrosis.  2. Hypertension.  3. Coronary arterial disease.  4. Hypercholesterolemia.  5. History of myocardial infarction on 10/05/01.  6. Status post coronary artery bypass grafting on 10/10/01.  7. History of hepatitis.  8. Postmenopausal.    PLAN:  The patient will be admitted to [facility] to undergo a  right total hip replacement arthroplasty.  Her CVTS surgeon is Dr. Laneta Simmers,  and her cardiologist is Dr. Daleen Squibb.  Dr. Laneta Simmers and Dr. Daleen Squibb will be notified  of the room number on admission, and will be consulted if needed for any  assistance with this patient throughout the hospital course.      Alexzandrew L. Julien Girt, P.A.              Ollen Gross, MD    ALP/MEDQ  D:  03/10/2002  T:  03/10/2002  Job:  [id number]   cc:   Thomas C. Daleen Squibb, M.D. [facility]   Evelene Croon, MD  [address]  [place]  [place] [address]  Fax: [phone]

## 2010-12-01 ENCOUNTER — Other Ambulatory Visit: Payer: Self-pay | Admitting: Cardiology

## 2010-12-29 ENCOUNTER — Other Ambulatory Visit: Payer: Self-pay | Admitting: Cardiology

## 2011-01-25 ENCOUNTER — Other Ambulatory Visit: Payer: Self-pay | Admitting: Cardiology

## 2011-02-24 ENCOUNTER — Other Ambulatory Visit: Payer: Self-pay | Admitting: Cardiology

## 2011-04-15 ENCOUNTER — Other Ambulatory Visit: Payer: Self-pay | Admitting: *Deleted

## 2011-04-15 MED ORDER — HYDROCHLOROTHIAZIDE 12.5 MG PO CAPS: 12.5000 mg | ORAL_CAPSULE | ORAL | Status: DC

## 2011-04-15 MED ORDER — CARVEDILOL 25 MG PO TABS: 25.0000 mg | ORAL_TABLET | Freq: Two times a day (BID) | ORAL | Status: DC

## 2011-04-15 MED ORDER — AMLODIPINE BESY-BENAZEPRIL HCL 5-20 MG PO CAPS: 1.0000 | ORAL_CAPSULE | Freq: Every day | ORAL | Status: DC

## 2011-04-15 MED ORDER — POTASSIUM CHLORIDE 10 MEQ PO TBCR: 10.0000 meq | EXTENDED_RELEASE_TABLET | Freq: Every day | ORAL | Status: DC

## 2011-04-15 MED ORDER — EZETIMIBE-SIMVASTATIN 10-40 MG PO TABS: 1.0000 | ORAL_TABLET | Freq: Every day | ORAL | Status: DC

## 2011-05-03 ENCOUNTER — Other Ambulatory Visit: Payer: BC Managed Care – PPO | Admitting: *Deleted

## 2011-05-05 ENCOUNTER — Ambulatory Visit: Payer: BC Managed Care – PPO | Admitting: Cardiology

## 2011-05-18 ENCOUNTER — Other Ambulatory Visit: Payer: BC Managed Care – PPO | Admitting: *Deleted

## 2011-05-18 ENCOUNTER — Encounter: Payer: Self-pay | Admitting: Cardiology

## 2011-05-18 ENCOUNTER — Ambulatory Visit (INDEPENDENT_AMBULATORY_CARE_PROVIDER_SITE_OTHER): Payer: BC Managed Care – PPO | Admitting: Cardiology

## 2011-05-18 VITALS — BP 132/60 | HR 72 | Resp 18 | Ht 65.0 in | Wt 188.4 lb

## 2011-05-18 DIAGNOSIS — I25810 Atherosclerosis of coronary artery bypass graft(s) without angina pectoris: Secondary | ICD-10-CM

## 2011-05-18 DIAGNOSIS — I6529 Occlusion and stenosis of unspecified carotid artery: Secondary | ICD-10-CM

## 2011-05-18 DIAGNOSIS — E785 Hyperlipidemia, unspecified: Secondary | ICD-10-CM

## 2011-05-18 LAB — LIPID PANEL
Cholesterol: 116 mg/dL (ref 0–200)
Triglycerides: 148 mg/dL (ref 0.0–149.0)

## 2011-05-18 LAB — HEPATIC FUNCTION PANEL
ALT: 18 U/L (ref 0–35)
AST: 19 U/L (ref 0–37)
Albumin: 4.1 g/dL (ref 3.5–5.2)

## 2011-05-18 LAB — BASIC METABOLIC PANEL
BUN: 19 mg/dL (ref 6–23)
Chloride: 106 mEq/L (ref 96–112)
Creatinine, Ser: 1.1 mg/dL (ref 0.4–1.2)
Glucose, Bld: 111 mg/dL — ABNORMAL HIGH (ref 70–99)
Potassium: 4.2 mEq/L (ref 3.5–5.1)

## 2011-05-18 NOTE — Assessment & Plan Note (Signed)
Labs today. Will call patient with results.

## 2011-05-18 NOTE — Patient Instructions (Signed)
Your physician wants you to follow-up in:  6 months. You will receive a reminder letter in the mail two months in advance. If you don't receive a letter, please call our office to schedule the follow-up appointment.   

## 2011-05-18 NOTE — Progress Notes (Signed)
HPI Sophia Schroeder returns today for evaluation and management of her coronary disease, nonobstructive carotid disease, hypertension, and hyperlipidemia.  She's having no symptoms of angina or TIAs. She is fasting today for blood work. Her last carotid Doppler's were in May and showed nonobstructive disease.  Past Medical History  Diagnosis Date  . Carotid artery stenosis     without infarction  . Benign hypertension   . Mixed hyperlipidemia   . Coronary atherosclerosis of autologous vein bypass graft     Current Outpatient Prescriptions  Medication Sig Dispense Refill  . amLODipine-benazepril (LOTREL) 5-20 MG per capsule Take 1 capsule by mouth daily.  90 capsule  3  . aspirin 81 MG EC tablet Take 81 mg by mouth daily.        . calcium carbonate (OS-CAL) 600 MG TABS Take 600 mg by mouth. occasionally       . carvedilol (COREG) 25 MG tablet Take 1 tablet (25 mg total) by mouth 2 (two) times daily with a meal.  180 tablet  3  . ezetimibe-simvastatin (VYTORIN) 10-40 MG per tablet Take 1 tablet by mouth at bedtime.  90 tablet  3  . hydrochlorothiazide (MICROZIDE) 12.5 MG capsule Take 1 capsule (12.5 mg total) by mouth every morning.  90 capsule  3  . HYDROcodone-acetaminophen (VICODIN) 5-500 MG per tablet Take 1 tablet by mouth every 6 (six) hours as needed.       . Multiple Vitamin (MULTIVITAMIN) tablet 1 tab occasionally      . Omega-3 Fatty Acids (FISH OIL) 300 MG CAPS 1 tab occasionally       . potassium chloride (KLOR-CON) 10 MEQ CR tablet Take 1 tablet (10 mEq total) by mouth daily.  90 tablet  3  . zolpidem (AMBIEN) 10 MG tablet Take 5 mg by mouth at bedtime as needed.         No Known Allergies  Family History  Problem Relation Age of Onset  . Heart disease Father   . Heart attack Father   . Stroke Mother   . Arthritis Mother     History   Social History  . Marital Status: Married    Spouse Name: N/A    Number of Children: N/A  . Years of Education: N/A   Occupational  History  . Not on file.   Social History Main Topics  . Smoking status: Never Smoker   . Smokeless tobacco: Not on file  . Alcohol Use: No  . Drug Use: No  . Sexually Active: Not on file   Other Topics Concern  . Not on file   Social History Narrative  . No narrative on file    ROS ALL NEGATIVE EXCEPT THOSE NOTED IN HPI  PE  General Appearance: well developed, well nourished in no acute distress, obese HEENT: symmetrical face, PERRLA, good dentition  Neck: no JVD, thyromegaly, or adenopathy, trachea midline Chest: symmetric without deformity Cardiac: PMI non-displaced, RRR, normal S1, S2, no gallop or murmur Lung: clear to ausculation and percussion Vascular: all pulses full without carotid bruits Abdominal: nondistended, nontender, good bowel sounds, no HSM, no bruits Extremities: no cyanosis, clubbing, 1+ pitting edema left lower extremityno sign of DVT, no varicosities  Skin: normal color, no rashes Neuro: alert and oriented x 3, non-focal Pysch: normal affect  EKG  BMET    Component Value Date/Time   NA 142 03/15/2010 0000   K 4.3 03/15/2010 0000   CL 104 03/15/2010 0000   CO2 31 03/15/2010 0000  GLUCOSE 99 03/15/2010 0000   BUN 16 03/15/2010 0000   CREATININE 1.0 03/15/2010 0000   CALCIUM 9.3 03/15/2010 0000   GFRNONAA 56.18 03/15/2010 0000   GFRAA 71 10/03/2007 1119    Lipid Panel     Component Value Date/Time   CHOL 125 03/15/2010 0000   TRIG 79.0 03/15/2010 0000   HDL 38.30* 03/15/2010 0000   CHOLHDL 3 03/15/2010 0000   VLDL 15.8 03/15/2010 0000   LDLCALC 71 03/15/2010 0000    CBC No results found for this basename: wbc, rbc, hgb, hct, plt, mcv, mch, mchc, rdw, neutrabs, lymphsabs, monoabs, eosabs, basosabs

## 2011-05-18 NOTE — Assessment & Plan Note (Signed)
Stable. No change in medical therapy. 

## 2011-05-18 NOTE — Assessment & Plan Note (Signed)
Stable. Repeat Dopplers in May 2013

## 2011-10-29 HISTORY — PX: CATARACT EXTRACTION: SUR2

## 2011-11-24 ENCOUNTER — Other Ambulatory Visit: Payer: Self-pay | Admitting: Cardiology

## 2011-11-24 DIAGNOSIS — I6529 Occlusion and stenosis of unspecified carotid artery: Secondary | ICD-10-CM

## 2011-11-30 ENCOUNTER — Encounter: Payer: Self-pay | Admitting: Cardiology

## 2011-11-30 ENCOUNTER — Ambulatory Visit (INDEPENDENT_AMBULATORY_CARE_PROVIDER_SITE_OTHER): Payer: Medicare Other | Admitting: Cardiology

## 2011-11-30 ENCOUNTER — Encounter (INDEPENDENT_AMBULATORY_CARE_PROVIDER_SITE_OTHER): Payer: Medicare Other

## 2011-11-30 VITALS — BP 118/60 | HR 50 | Ht 65.0 in | Wt 188.0 lb

## 2011-11-30 DIAGNOSIS — I6529 Occlusion and stenosis of unspecified carotid artery: Secondary | ICD-10-CM

## 2011-11-30 DIAGNOSIS — I10 Essential (primary) hypertension: Secondary | ICD-10-CM

## 2011-11-30 DIAGNOSIS — I25810 Atherosclerosis of coronary artery bypass graft(s) without angina pectoris: Secondary | ICD-10-CM

## 2011-11-30 DIAGNOSIS — E785 Hyperlipidemia, unspecified: Secondary | ICD-10-CM

## 2011-11-30 DIAGNOSIS — I739 Peripheral vascular disease, unspecified: Secondary | ICD-10-CM

## 2011-11-30 NOTE — Progress Notes (Signed)
HPI Sophia Schroeder returns today for evaluation and management of her history of coronary artery disease, coronary bypass grafting, history of MI, carotid artery disease, hypertension and mixed hyperlipidemia.  She has no complaints of angina or TIAs. She denies orthopnea, PND or edema.  Carotid Dopplers by preliminary report today are unchanged.  Past Medical History  Diagnosis Date  . Carotid artery stenosis     without infarction  . Benign hypertension   . Mixed hyperlipidemia   . Coronary atherosclerosis of autologous vein bypass graft     Current Outpatient Prescriptions  Medication Sig Dispense Refill  . amLODipine-benazepril (LOTREL) 5-20 MG per capsule Take 1 capsule by mouth daily.  90 capsule  3  . aspirin 81 MG EC tablet Take 81 mg by mouth daily.        . calcium carbonate (OS-CAL) 600 MG TABS Take 600 mg by mouth. occasionally       . carvedilol (COREG) 25 MG tablet Take 1 tablet (25 mg total) by mouth 2 (two) times daily with a meal.  180 tablet  3  . ezetimibe-simvastatin (VYTORIN) 10-40 MG per tablet Take 1 tablet by mouth at bedtime.  90 tablet  3  . hydrochlorothiazide (MICROZIDE) 12.5 MG capsule Take 1 capsule (12.5 mg total) by mouth every morning.  90 capsule  3  . ketorolac (ACULAR) 0.4 % SOLN as directed.      . Multiple Vitamin (MULTIVITAMIN) tablet 1 tab occasionally      . ofloxacin (OCUFLOX) 0.3 % ophthalmic solution as directed.      . Omega-3 Fatty Acids (FISH OIL) 300 MG CAPS 1 tab occasionally       . potassium chloride (KLOR-CON) 10 MEQ CR tablet Take 1 tablet (10 mEq total) by mouth daily.  90 tablet  3  . prednisoLONE acetate (PRED FORTE) 1 % ophthalmic suspension as directed.        No Known Allergies  Family History  Problem Relation Age of Onset  . Heart disease Father   . Heart attack Father   . Stroke Mother   . Arthritis Mother     History   Social History  . Marital Status: Married    Spouse Name: N/A    Number of Children: N/A  .  Years of Education: N/A   Occupational History  . Not on file.   Social History Main Topics  . Smoking status: Never Smoker   . Smokeless tobacco: Not on file  . Alcohol Use: No  . Drug Use: No  . Sexually Active: Not on file   Other Topics Concern  . Not on file   Social History Narrative  . No narrative on file    ROS ALL NEGATIVE EXCEPT THOSE NOTED IN HPI  PE  General Appearance: well developed, well nourished in no acute distress, overweight but looks her stated age. HEENT: symmetrical face, PERRLA, good dentition  Neck: no JVD, thyromegaly, or adenopathy, trachea midline Chest: symmetric without deformity Cardiac: PMI non-displaced, RRR, normal S1, S2, no gallop or murmur Lung: clear to ausculation and percussion Vascular: all pulses full without bruits  Abdominal: nondistended, nontender, good bowel sounds, no HSM, no bruits Extremities: no cyanosis, clubbing or edema, no sign of DVT, no varicosities  Skin: normal color, no rashes Neuro: alert and oriented x 3, non-focal Pysch: normal affect  EKG  BMET    Component Value Date/Time   NA 143 05/18/2011 0922   K 4.2 05/18/2011 0922   CL 106 05/18/2011  0922   CO2 28 05/18/2011 0922   GLUCOSE 111* 05/18/2011 0922   BUN 19 05/18/2011 0922   CREATININE 1.1 05/18/2011 0922   CALCIUM 9.1 05/18/2011 0922   GFRNONAA 56.18 03/15/2010 0000   GFRAA 71 10/03/2007 1119    Lipid Panel     Component Value Date/Time   CHOL 116 05/18/2011 0922   TRIG 148.0 05/18/2011 0922   HDL 33.30* 05/18/2011 0922   CHOLHDL 3 05/18/2011 0922   VLDL 29.6 05/18/2011 0922   LDLCALC 53 05/18/2011 0922    CBC No results found for this basename: wbc, rbc, hgb, hct, plt, mcv, mch, mchc, rdw, neutrabs, lymphsabs, monoabs, eosabs, basosabs

## 2011-11-30 NOTE — Patient Instructions (Addendum)
Your physician recommends that you continue on your current medications as directed. Please refer to the Current Medication list given to you today.  Your physician wants you to follow-up in: 1 year. You will receive a reminder letter in the mail two months in advance. If you don't receive a letter, please call our office to schedule the follow-up appointment.  

## 2011-11-30 NOTE — Assessment & Plan Note (Signed)
Good control

## 2011-11-30 NOTE — Assessment & Plan Note (Signed)
At goal except low HDL.

## 2011-11-30 NOTE — Assessment & Plan Note (Signed)
Stable. Continue secondary preventative therapy. 

## 2011-11-30 NOTE — Assessment & Plan Note (Signed)
Asymptomatic and no change in carotid Doppler today. Continue secondary preventative therapy.

## 2012-05-02 ENCOUNTER — Other Ambulatory Visit: Payer: Self-pay | Admitting: *Deleted

## 2012-05-02 MED ORDER — EZETIMIBE-SIMVASTATIN 10-40 MG PO TABS: 1.0000 | ORAL_TABLET | Freq: Every day | ORAL | Status: DC

## 2012-05-02 MED ORDER — AMLODIPINE BESY-BENAZEPRIL HCL 5-20 MG PO CAPS: 1.0000 | ORAL_CAPSULE | Freq: Every day | ORAL | Status: DC

## 2012-05-02 MED ORDER — POTASSIUM CHLORIDE ER 10 MEQ PO TBCR: 10.0000 meq | EXTENDED_RELEASE_TABLET | Freq: Every day | ORAL | Status: DC

## 2012-05-02 MED ORDER — HYDROCHLOROTHIAZIDE 12.5 MG PO CAPS: 12.5000 mg | ORAL_CAPSULE | ORAL | Status: DC

## 2012-05-02 MED ORDER — CARVEDILOL 25 MG PO TABS: 25.0000 mg | ORAL_TABLET | Freq: Two times a day (BID) | ORAL | Status: DC

## 2012-08-14 ENCOUNTER — Other Ambulatory Visit: Payer: Self-pay | Admitting: *Deleted

## 2012-08-14 MED ORDER — POTASSIUM CHLORIDE ER 10 MEQ PO TBCR: 10.0000 meq | EXTENDED_RELEASE_TABLET | Freq: Every day | ORAL | Status: DC

## 2012-08-14 MED ORDER — HYDROCHLOROTHIAZIDE 12.5 MG PO CAPS: 12.5000 mg | ORAL_CAPSULE | ORAL | Status: DC

## 2012-08-14 MED ORDER — CARVEDILOL 25 MG PO TABS: 25.0000 mg | ORAL_TABLET | Freq: Two times a day (BID) | ORAL | Status: DC

## 2013-01-17 ENCOUNTER — Other Ambulatory Visit: Payer: Self-pay | Admitting: Cardiology

## 2013-01-24 ENCOUNTER — Encounter: Payer: Self-pay | Admitting: Cardiology

## 2013-01-24 ENCOUNTER — Ambulatory Visit (INDEPENDENT_AMBULATORY_CARE_PROVIDER_SITE_OTHER): Payer: Medicare PPO | Admitting: Cardiology

## 2013-01-24 ENCOUNTER — Encounter (INDEPENDENT_AMBULATORY_CARE_PROVIDER_SITE_OTHER): Payer: Medicare PPO

## 2013-01-24 VITALS — BP 112/78 | HR 55 | Wt 185.0 lb

## 2013-01-24 DIAGNOSIS — I10 Essential (primary) hypertension: Secondary | ICD-10-CM

## 2013-01-24 DIAGNOSIS — I25810 Atherosclerosis of coronary artery bypass graft(s) without angina pectoris: Secondary | ICD-10-CM

## 2013-01-24 DIAGNOSIS — I6529 Occlusion and stenosis of unspecified carotid artery: Secondary | ICD-10-CM

## 2013-01-24 DIAGNOSIS — I739 Peripheral vascular disease, unspecified: Secondary | ICD-10-CM

## 2013-01-24 DIAGNOSIS — E785 Hyperlipidemia, unspecified: Secondary | ICD-10-CM

## 2013-01-24 MED ORDER — HYDROCHLOROTHIAZIDE 12.5 MG PO CAPS: ORAL_CAPSULE | ORAL | Status: DC

## 2013-01-24 MED ORDER — POTASSIUM CHLORIDE ER 10 MEQ PO TBCR: 10.0000 meq | EXTENDED_RELEASE_TABLET | Freq: Every day | ORAL | Status: DC

## 2013-01-24 NOTE — Assessment & Plan Note (Signed)
Preliminary results are stable. Final report will be called her.

## 2013-01-24 NOTE — Patient Instructions (Addendum)
You will need lab work today.  Fasting cholesterol & CMP We will call you with your results  Your physician recommends that you continue on your current medications as directed. Please refer to the Current Medication list given to you today.  Your physician wants you to follow-up in: 1 year with Dr. Olga Millers.  You will receive a reminder letter in the mail two months in advance. If you don't receive a letter, please call our office to schedule the follow-up appointment.

## 2013-01-24 NOTE — Progress Notes (Signed)
HPI Sophia Schroeder returns today for evaluation and management of her coronary disease and carotid disease. We are also following her blood pressure and her lipids. She denies any angina. She is working out in a gym on regular basis. She looks really good. She may have lost a few pounds.  She is to blood work today. She is fasting. Preliminary carotids today are stable. She denies any symptoms of TIAs.  Past Medical History  Diagnosis Date  . Carotid artery stenosis     without infarction  . Benign hypertension   . Mixed hyperlipidemia   . Coronary atherosclerosis of autologous vein bypass graft     Current Outpatient Prescriptions  Medication Sig Dispense Refill  . amLODipine-benazepril (LOTREL) 5-20 MG per capsule Take 1 capsule by mouth daily.  90 capsule  3  . aspirin 81 MG EC tablet Take 81 mg by mouth daily.        . calcium carbonate (OS-CAL) 600 MG TABS Take 600 mg by mouth. occasionally       . carvedilol (COREG) 25 MG tablet TAKE 1 TABLET TWICE DAILY WITH MEALS  180 tablet  1  . ezetimibe-simvastatin (VYTORIN) 10-40 MG per tablet Take 1 tablet by mouth at bedtime.  90 tablet  3  . hydrochlorothiazide (MICROZIDE) 12.5 MG capsule TAKE 1 CAPSULE EVERY MORNING  90 capsule  1  . Multiple Vitamin (MULTIVITAMIN) tablet 1 tab occasionally      . potassium chloride (K-DUR) 10 MEQ tablet Take 1 tablet (10 mEq total) by mouth daily.  90 tablet  1   No current facility-administered medications for this visit.    No Known Allergies  Family History  Problem Relation Age of Onset  . Heart disease Father   . Heart attack Father   . Stroke Mother   . Arthritis Mother     History   Social History  . Marital Status: Married    Spouse Name: N/A    Number of Children: N/A  . Years of Education: N/A   Occupational History  . Not on file.   Social History Main Topics  . Smoking status: Never Smoker   . Smokeless tobacco: Not on file  . Alcohol Use: No  . Drug Use: No  . Sexual  Activity: Not on file   Other Topics Concern  . Not on file   Social History Narrative  . No narrative on file    ROS ALL NEGATIVE EXCEPT THOSE NOTED IN HPI  PE  General Appearance: well developed, well nourished in no acute distress, overweight HEENT: symmetrical face, PERRLA, good dentition  Neck: no JVD, thyromegaly, or adenopathy, trachea midline Chest: symmetric without deformity Cardiac: PMI non-displaced, RRR, normal S1, S2, no gallop or murmur Lung: clear to ausculation and percussion Vascular: all pulses full without bruits  Abdominal: nondistended, nontender, good bowel sounds, no HSM, no bruits Extremities: no cyanosis, clubbing or edema, no sign of DVT, no varicosities  Skin: normal color, no rashes Neuro: alert and oriented x 3, non-focal Pysch: normal affect  EKG Normal sinus rhythm, low-voltage, poor progression anterior precordium, stable. BMET    Component Value Date/Time   NA 143 05/18/2011 0922   K 4.2 05/18/2011 0922   CL 106 05/18/2011 0922   CO2 28 05/18/2011 0922   GLUCOSE 111* 05/18/2011 0922   BUN 19 05/18/2011 0922   CREATININE 1.1 05/18/2011 0922   CALCIUM 9.1 05/18/2011 0922   GFRNONAA 56.18 03/15/2010 0000   GFRAA 71 10/03/2007 1119  Lipid Panel     Component Value Date/Time   CHOL 116 05/18/2011 0922   TRIG 148.0 05/18/2011 0922   HDL 33.30* 05/18/2011 0922   CHOLHDL 3 05/18/2011 0922   VLDL 29.6 05/18/2011 0922   LDLCALC 53 05/18/2011 0922    CBC No results found for this basename: wbc, rbc, hgb, hct, plt, mcv, mch, mchc, rdw, neutrabs, lymphsabs, monoabs, eosabs, basosabs

## 2013-01-24 NOTE — Assessment & Plan Note (Signed)
Return in one year to Dr. Jens Som

## 2013-01-24 NOTE — Assessment & Plan Note (Signed)
She has fasted today. We'll check lipids and a comprehensive metabolic profile.

## 2013-01-24 NOTE — Assessment & Plan Note (Signed)
Good control. No change in medical therapy. 

## 2013-01-25 LAB — LIPID PANEL
LDL Cholesterol: 64 mg/dL (ref 0–99)
Total CHOL/HDL Ratio: 3
VLDL: 24.6 mg/dL (ref 0.0–40.0)

## 2013-01-25 LAB — BASIC METABOLIC PANEL
BUN: 26 mg/dL — ABNORMAL HIGH (ref 6–23)
CO2: 26 mEq/L (ref 19–32)
Chloride: 102 mEq/L (ref 96–112)
Creatinine, Ser: 1.3 mg/dL — ABNORMAL HIGH (ref 0.4–1.2)
Potassium: 4 mEq/L (ref 3.5–5.1)

## 2013-01-25 LAB — HEPATIC FUNCTION PANEL
Albumin: 4.2 g/dL (ref 3.5–5.2)
Alkaline Phosphatase: 49 U/L (ref 39–117)
Bilirubin, Direct: 0.1 mg/dL (ref 0.0–0.3)
Total Protein: 7.8 g/dL (ref 6.0–8.3)

## 2013-02-04 ENCOUNTER — Telehealth: Payer: Self-pay | Admitting: Cardiology

## 2013-02-04 NOTE — Telephone Encounter (Signed)
Want to get results of labs.

## 2013-02-04 NOTE — Telephone Encounter (Signed)
Spoke with patient about lab results done 01/24/13

## 2013-04-26 ENCOUNTER — Other Ambulatory Visit: Payer: Self-pay

## 2013-04-26 MED ORDER — AMLODIPINE BESY-BENAZEPRIL HCL 5-20 MG PO CAPS: 1.0000 | ORAL_CAPSULE | Freq: Every day | ORAL | Status: DC

## 2013-04-29 ENCOUNTER — Telehealth: Payer: Self-pay | Admitting: *Deleted

## 2013-04-29 MED ORDER — POTASSIUM CHLORIDE ER 10 MEQ PO TBCR: 10.0000 meq | EXTENDED_RELEASE_TABLET | Freq: Every day | ORAL | Status: DC

## 2013-04-29 MED ORDER — AMLODIPINE BESY-BENAZEPRIL HCL 5-20 MG PO CAPS: 1.0000 | ORAL_CAPSULE | Freq: Every day | ORAL | Status: DC

## 2013-04-29 MED ORDER — EZETIMIBE-SIMVASTATIN 10-40 MG PO TABS: 1.0000 | ORAL_TABLET | Freq: Every day | ORAL | Status: DC

## 2013-04-29 MED ORDER — CARVEDILOL 25 MG PO TABS: 25.0000 mg | ORAL_TABLET | Freq: Two times a day (BID) | ORAL | Status: DC

## 2013-04-29 NOTE — Telephone Encounter (Signed)
Patient walked in for refills

## 2013-05-29 ENCOUNTER — Other Ambulatory Visit: Payer: Self-pay

## 2013-05-29 MED ORDER — HYDROCHLOROTHIAZIDE 12.5 MG PO CAPS: ORAL_CAPSULE | ORAL | Status: DC

## 2013-07-18 ENCOUNTER — Other Ambulatory Visit: Payer: Self-pay | Admitting: Cardiology

## 2013-12-10 ENCOUNTER — Other Ambulatory Visit: Payer: Self-pay

## 2013-12-10 MED ORDER — POTASSIUM CHLORIDE ER 10 MEQ PO TBCR: 10.0000 meq | EXTENDED_RELEASE_TABLET | Freq: Every day | ORAL | Status: DC

## 2014-01-15 ENCOUNTER — Other Ambulatory Visit (HOSPITAL_COMMUNITY): Payer: Self-pay | Admitting: *Deleted

## 2014-01-15 DIAGNOSIS — I6529 Occlusion and stenosis of unspecified carotid artery: Secondary | ICD-10-CM

## 2014-01-27 ENCOUNTER — Ambulatory Visit (HOSPITAL_COMMUNITY): Payer: Medicare HMO | Attending: Cardiovascular Disease | Admitting: Radiology

## 2014-01-27 DIAGNOSIS — I10 Essential (primary) hypertension: Secondary | ICD-10-CM | POA: Insufficient documentation

## 2014-01-27 DIAGNOSIS — I2510 Atherosclerotic heart disease of native coronary artery without angina pectoris: Secondary | ICD-10-CM | POA: Diagnosis not present

## 2014-01-27 DIAGNOSIS — I6529 Occlusion and stenosis of unspecified carotid artery: Secondary | ICD-10-CM

## 2014-01-27 DIAGNOSIS — I658 Occlusion and stenosis of other precerebral arteries: Secondary | ICD-10-CM | POA: Diagnosis not present

## 2014-01-27 DIAGNOSIS — Z951 Presence of aortocoronary bypass graft: Secondary | ICD-10-CM | POA: Insufficient documentation

## 2014-01-27 DIAGNOSIS — E785 Hyperlipidemia, unspecified: Secondary | ICD-10-CM | POA: Insufficient documentation

## 2014-01-27 NOTE — Progress Notes (Signed)
Carotid Duplex performed. 

## 2014-01-30 ENCOUNTER — Institutional Professional Consult (permissible substitution): Payer: Medicare PPO | Admitting: Cardiology

## 2014-02-11 ENCOUNTER — Encounter: Payer: Self-pay | Admitting: Cardiology

## 2014-02-11 ENCOUNTER — Ambulatory Visit (INDEPENDENT_AMBULATORY_CARE_PROVIDER_SITE_OTHER): Payer: Commercial Managed Care - HMO | Admitting: Cardiology

## 2014-02-11 VITALS — BP 131/60 | HR 50 | Ht 65.5 in | Wt 180.8 lb

## 2014-02-11 DIAGNOSIS — I10 Essential (primary) hypertension: Secondary | ICD-10-CM

## 2014-02-11 DIAGNOSIS — I25810 Atherosclerosis of coronary artery bypass graft(s) without angina pectoris: Secondary | ICD-10-CM

## 2014-02-11 MED ORDER — ATORVASTATIN CALCIUM 80 MG PO TABS
80.0000 mg | ORAL_TABLET | Freq: Every day | ORAL | Status: DC
Start: 2014-02-11 — End: 2014-09-02

## 2014-02-11 NOTE — Assessment & Plan Note (Signed)
Vytorin is expensive for patient. Discontinue after present prescription expires. Then begin Lipitor 80 mg daily. Check lipids and liver 4 weeks after that.

## 2014-02-11 NOTE — Progress Notes (Signed)
      HPI: 73 year old female for followup of coronary artery disease. Previously followed by Dr. Daleen Squibb. In 2003 the patient underwent coronary artery bypass graft with a LIMA to the LAD, saphenous vein graft to the second diagonal, sequential saphenous vein graft to the first diagonal and obtuse marginal and a sequential saphenous vein graft to the PDA and posterior lateral. Carotid Dopplers August 2015 showed 60-79% bilateral stenosis and followup recommended in one year. Since last seen there is no dyspnea, chest pain, palpitations or syncope.  Current Outpatient Prescriptions  Medication Sig Dispense Refill  . amLODipine-benazepril (LOTREL) 5-20 MG per capsule Take 1 capsule by mouth daily.  90 capsule  3  . aspirin 81 MG EC tablet Take 81 mg by mouth daily.        . calcium carbonate (OS-CAL) 600 MG TABS Take 600 mg by mouth. occasionally       . carvedilol (COREG) 25 MG tablet Take 1 tablet (25 mg total) by mouth 2 (two) times daily.  180 tablet  3  . ezetimibe-simvastatin (VYTORIN) 10-40 MG per tablet Take 1 tablet by mouth at bedtime.  90 tablet  3  . hydrochlorothiazide (MICROZIDE) 12.5 MG capsule TAKE 1 CAPSULE EVERY MORNING  90 capsule  3  . Multiple Vitamin (MULTIVITAMIN) tablet 1 tab occasionally      . potassium chloride (K-DUR) 10 MEQ tablet Take 1 tablet (10 mEq total) by mouth daily.  90 tablet  1   No current facility-administered medications for this visit.     Past Medical History  Diagnosis Date  . Carotid artery stenosis     without infarction  . Benign hypertension   . Mixed hyperlipidemia   . Coronary atherosclerosis of autologous vein bypass graft     Past Surgical History  Procedure Laterality Date  . Tubal ligation    . Nose surgery    . Coronary artery bypass graft  10/10/2001  . Right total hip arthroplasty  03/11/2002  . Cataract extraction  june 2013    History   Social History  . Marital Status: Married    Spouse Name: N/A    Number of  Children: N/A  . Years of Education: N/A   Occupational History  . Not on file.   Social History Main Topics  . Smoking status: Never Smoker   . Smokeless tobacco: Not on file  . Alcohol Use: No  . Drug Use: No  . Sexual Activity: Not on file   Other Topics Concern  . Not on file   Social History Narrative  . No narrative on file    ROS: no fevers or chills, productive cough, hemoptysis, dysphasia, odynophagia, melena, hematochezia, dysuria, hematuria, rash, seizure activity, orthopnea, PND, pedal edema, claudication. Remaining systems are negative.  Physical Exam: Well-developed well-nourished in no acute distress.  Skin is warm and dry.  HEENT is normal.  Neck is supple.  Chest is clear to auscultation with normal expansion.  Cardiovascular exam is regular rate and rhythm.  Abdominal exam nontender or distended. No masses palpated. Extremities show no edema. neuro grossly intact  ECG Sinus bradycardia at a rate of 58. Prior septal infarct. Nonspecific ST changes.

## 2014-02-11 NOTE — Patient Instructions (Signed)
Your physician wants you to follow-up in: ONE YEAR WITH DR Shelda Pal will receive a reminder letter in the mail two months in advance. If you don't receive a letter, please call our office to schedule the follow-up appointment.   STOP VYTORIN  START ATORVASTATIN 80 MG ONCE DAILY  Your physician recommends that you return for lab work in: 4 WEEKS=DO NOT EAT PRIOR TO LAB WORK  Your physician has requested that you have a carotid duplex. This test is an ultrasound of the carotid arteries in your neck. It looks at blood flow through these arteries that supply the brain with blood. Allow one hour for this exam. There are no restrictions or special instructions.DUE IN 12/2014

## 2014-02-11 NOTE — Assessment & Plan Note (Signed)
Continue aspirin and statin.Patient remains asymptomatic.

## 2014-02-11 NOTE — Assessment & Plan Note (Signed)
Continue present medications. Check potassium and renal function. 

## 2014-02-11 NOTE — Assessment & Plan Note (Signed)
Continue aspirin and statin. Schedule followup carotid Dopplers August 2016.

## 2014-04-16 ENCOUNTER — Other Ambulatory Visit: Payer: Self-pay | Admitting: Cardiology

## 2014-05-19 ENCOUNTER — Other Ambulatory Visit: Payer: Self-pay | Admitting: Cardiology

## 2014-05-19 MED ORDER — CARVEDILOL 25 MG PO TABS
25.0000 mg | ORAL_TABLET | Freq: Two times a day (BID) | ORAL | Status: DC
Start: 2014-05-19 — End: 2015-03-11

## 2014-05-19 NOTE — Telephone Encounter (Signed)
Rx(s) sent to pharmacy electronically.  

## 2014-05-19 NOTE — Telephone Encounter (Signed)
Patient would like for Debra (Dr. Ludwig Clarksrenshaw's nurse) to call [facility] to refill her carvedilol 25 mg.

## 2014-05-26 ENCOUNTER — Other Ambulatory Visit: Payer: Self-pay | Admitting: Cardiology

## 2014-07-01 ENCOUNTER — Other Ambulatory Visit: Payer: Self-pay | Admitting: Cardiology

## 2014-07-14 ENCOUNTER — Other Ambulatory Visit: Payer: Self-pay | Admitting: Cardiology

## 2014-08-21 ENCOUNTER — Telehealth: Payer: Self-pay | Admitting: Cardiology

## 2014-08-21 DIAGNOSIS — E785 Hyperlipidemia, unspecified: Secondary | ICD-10-CM

## 2014-08-21 NOTE — Telephone Encounter (Signed)
Spoke with pt, lab orders placed in the mail to patient home address.

## 2014-08-21 NOTE — Telephone Encounter (Signed)
New message    Patient calling need blood drawn  Making sure new medicine is not affecting her liver.

## 2014-09-02 ENCOUNTER — Other Ambulatory Visit: Payer: Self-pay | Admitting: *Deleted

## 2014-09-02 ENCOUNTER — Encounter: Payer: Self-pay | Admitting: *Deleted

## 2014-09-02 DIAGNOSIS — I25810 Atherosclerosis of coronary artery bypass graft(s) without angina pectoris: Secondary | ICD-10-CM

## 2014-09-02 LAB — HEPATIC FUNCTION PANEL
ALBUMIN: 4.2 g/dL (ref 3.5–5.2)
ALK PHOS: 65 U/L (ref 39–117)
ALT: 12 U/L (ref 0–35)
AST: 15 U/L (ref 0–37)
BILIRUBIN TOTAL: 1.3 mg/dL — AB (ref 0.2–1.2)
Bilirubin, Direct: 0.2 mg/dL (ref 0.0–0.3)
Indirect Bilirubin: 1.1 mg/dL (ref 0.2–1.2)
TOTAL PROTEIN: 7.4 g/dL (ref 6.0–8.3)

## 2014-09-02 LAB — LIPID PANEL
CHOL/HDL RATIO: 3.9 ratio
Cholesterol: 144 mg/dL (ref 0–200)
HDL: 37 mg/dL — ABNORMAL LOW (ref >=46)
LDL Cholesterol: 86 mg/dL (ref 0–99)
Triglycerides: 107 mg/dL (ref <150)
VLDL: 21 mg/dL (ref 0–40)

## 2014-09-02 MED ORDER — ATORVASTATIN CALCIUM 80 MG PO TABS: 80.0000 mg | ORAL_TABLET | Freq: Every day | ORAL | Status: DC

## 2014-09-02 NOTE — Telephone Encounter (Signed)
Patient notified of labs results and [facility]. Copy of labs also mailed to patient.

## 2015-02-25 ENCOUNTER — Other Ambulatory Visit: Payer: Self-pay | Admitting: Cardiology

## 2015-02-25 DIAGNOSIS — I6523 Occlusion and stenosis of bilateral carotid arteries: Secondary | ICD-10-CM

## 2015-03-02 NOTE — Progress Notes (Signed)
      HPI: FU coronary artery disease. In 2003 the patient underwent coronary artery bypass graft with a LIMA to the LAD, saphenous vein graft to the second diagonal, sequential saphenous vein graft to the first diagonal and obtuse marginal and a sequential saphenous vein graft to the PDA and posterior lateral. Carotid Dopplers Oct 2016 showed 60-79% left and 40-59% right stenosis and followup recommended in one year. Since last seen the patient has dyspnea with more extreme activities but not with routine activities. It is relieved with rest. It is not associated with chest pain. There is no orthopnea, PND or pedal edema. There is no syncope or palpitations. There is no exertional chest pain.    Current Outpatient Prescriptions  Medication Sig Dispense Refill  . amLODipine-benazepril (LOTREL) 5-20 MG per capsule take 1 capsule by mouth once daily 90 capsule 3  . aspirin 81 MG EC tablet Take 81 mg by mouth daily.      Marland Kitchen atorvastatin (LIPITOR) 80 MG tablet TAKE 1 TABLET EVERY DAY 90 tablet 0  . calcium carbonate (OS-CAL) 600 MG TABS Take 600 mg by mouth. occasionally     . carvedilol (COREG) 25 MG tablet Take 1 tablet (25 mg total) by mouth 2 (two) times daily. 180 tablet 2  . hydrochlorothiazide (MICROZIDE) 12.5 MG capsule TAKE 1 CAPSULE EVERY MORNING 90 capsule 3  . Multiple Vitamin (MULTIVITAMIN) tablet 1 tab occasionally    . potassium chloride (K-DUR) 10 MEQ tablet Take 1 tablet (10 mEq total) by mouth daily. 90 tablet 1  . potassium chloride (K-DUR,KLOR-CON) 10 MEQ tablet TAKE 1 TABLET EVERY DAY 90 tablet 3   No current facility-administered medications for this visit.     Past Medical History  Diagnosis Date  . Carotid artery stenosis     without infarction  . Benign hypertension   . Mixed hyperlipidemia   . Coronary atherosclerosis of autologous vein bypass graft     Past Surgical History  Procedure Laterality Date  . Tubal ligation    . Nose surgery    . Coronary artery  bypass graft  10/10/2001  . Right total hip arthroplasty  03/11/2002  . Cataract extraction  june 2013    Social History   Social History  . Marital Status: Married    Spouse Name: N/A  . Number of Children: N/A  . Years of Education: N/A   Occupational History  . Not on file.   Social History Main Topics  . Smoking status: Never Smoker   . Smokeless tobacco: Not on file  . Alcohol Use: No  . Drug Use: No  . Sexual Activity: Not on file   Other Topics Concern  . Not on file   Social History Narrative    ROS: no fevers or chills, productive cough, hemoptysis, dysphasia, odynophagia, melena, hematochezia, dysuria, hematuria, rash, seizure activity, orthopnea, PND, pedal edema, claudication. Remaining systems are negative.  Physical Exam: Well-developed well-nourished in no acute distress.  Skin is warm and dry.  HEENT is normal.  Neck is supple.  Chest is clear to auscultation with normal expansion.  Cardiovascular exam is regular rate and rhythm.  Abdominal exam nontender or distended. No masses palpated. Extremities show no edema. neuro grossly intact  ECG Sinus rhythm at a rate of 54. First degree AV block, septal infarct.

## 2015-03-03 ENCOUNTER — Ambulatory Visit (HOSPITAL_COMMUNITY)
Admission: RE | Admit: 2015-03-03 | Discharge: 2015-03-03 | Disposition: A | Discharge status: Home / Self Care | Payer: Commercial Managed Care - HMO | Source: Ambulatory Visit | Attending: Urology | Admitting: Urology

## 2015-03-03 DIAGNOSIS — I10 Essential (primary) hypertension: Secondary | ICD-10-CM | POA: Insufficient documentation

## 2015-03-03 DIAGNOSIS — E782 Mixed hyperlipidemia: Secondary | ICD-10-CM | POA: Insufficient documentation

## 2015-03-03 DIAGNOSIS — I6523 Occlusion and stenosis of bilateral carotid arteries: Secondary | ICD-10-CM | POA: Diagnosis not present

## 2015-03-06 ENCOUNTER — Other Ambulatory Visit: Payer: Self-pay | Admitting: Cardiology

## 2015-03-09 ENCOUNTER — Encounter: Payer: Self-pay | Admitting: Cardiology

## 2015-03-09 ENCOUNTER — Ambulatory Visit (INDEPENDENT_AMBULATORY_CARE_PROVIDER_SITE_OTHER): Payer: Commercial Managed Care - HMO | Admitting: Cardiology

## 2015-03-09 ENCOUNTER — Encounter: Payer: Self-pay | Admitting: *Deleted

## 2015-03-09 VITALS — BP 134/70 | HR 54 | Ht 65.0 in | Wt 178.0 lb

## 2015-03-09 DIAGNOSIS — I10 Essential (primary) hypertension: Secondary | ICD-10-CM | POA: Diagnosis not present

## 2015-03-09 DIAGNOSIS — E785 Hyperlipidemia, unspecified: Secondary | ICD-10-CM | POA: Diagnosis not present

## 2015-03-09 DIAGNOSIS — I2510 Atherosclerotic heart disease of native coronary artery without angina pectoris: Secondary | ICD-10-CM | POA: Diagnosis not present

## 2015-03-09 NOTE — Assessment & Plan Note (Signed)
Continue aspirin and statin. Follow-up carotid Dopplers October 2017. 

## 2015-03-09 NOTE — Assessment & Plan Note (Signed)
Continue aspirin and statin. Schedule nuclear study for risk stratification. 

## 2015-03-09 NOTE — Assessment & Plan Note (Signed)
Blood pressure controlled. Continue present medications. 

## 2015-03-09 NOTE — Patient Instructions (Signed)
Medication Instructions:   NO CHANGE  Testing/Procedures:  Your physician has requested that you have en exercise stress myoview. For further information please visit [email or web address]. Please follow instruction sheet, as given.    Follow-Up:  Your physician wants you to follow-up in: ONE YEAR WITH DR CRENSHAW You will receive a reminder letter in the mail two months in advance. If you don't receive a letter, please call our office to schedule the follow-up appointment.    

## 2015-03-09 NOTE — Assessment & Plan Note (Signed)
Continue statin. 

## 2015-03-11 ENCOUNTER — Other Ambulatory Visit: Payer: Self-pay | Admitting: Cardiology

## 2015-03-13 ENCOUNTER — Telehealth (HOSPITAL_COMMUNITY): Payer: Self-pay

## 2015-03-13 NOTE — Telephone Encounter (Signed)
Encounter complete. 

## 2015-03-18 ENCOUNTER — Ambulatory Visit (HOSPITAL_COMMUNITY)
Admission: RE | Admit: 2015-03-18 | Discharge: 2015-03-18 | Disposition: A | Discharge status: Home / Self Care | Payer: Commercial Managed Care - HMO | Source: Ambulatory Visit | Attending: Urology | Admitting: Urology

## 2015-03-18 DIAGNOSIS — Z8249 Family history of ischemic heart disease and other diseases of the circulatory system: Secondary | ICD-10-CM | POA: Insufficient documentation

## 2015-03-18 DIAGNOSIS — R0609 Other forms of dyspnea: Secondary | ICD-10-CM | POA: Diagnosis not present

## 2015-03-18 DIAGNOSIS — I10 Essential (primary) hypertension: Secondary | ICD-10-CM | POA: Diagnosis not present

## 2015-03-18 DIAGNOSIS — I2510 Atherosclerotic heart disease of native coronary artery without angina pectoris: Secondary | ICD-10-CM | POA: Diagnosis not present

## 2015-03-18 DIAGNOSIS — R9439 Abnormal result of other cardiovascular function study: Secondary | ICD-10-CM | POA: Diagnosis not present

## 2015-03-18 DIAGNOSIS — I739 Peripheral vascular disease, unspecified: Secondary | ICD-10-CM | POA: Insufficient documentation

## 2015-03-18 LAB — MYOCARDIAL PERFUSION IMAGING
CHL CUP NUCLEAR SSS: 29
CSEPPHR: 126 {beats}/min
Estimated workload: 7 METS
Exercise duration (min): 5 min
LV dias vol: 114 mL
LVSYSVOL: 58 mL
MPHR: 145 {beats}/min
NUC STRESS TID: 1.01
Percent HR: 86 %
RPE: 16
Rest HR: 77 {beats}/min
SDS: 7
SRS: 22

## 2015-03-18 MED ORDER — TECHNETIUM TC 99M SESTAMIBI GENERIC - CARDIOLITE
31.9000 | Freq: Once | INTRAVENOUS | Status: AC | PRN
  Administered 2015-03-18: 31.9 via INTRAVENOUS

## 2015-03-18 MED ORDER — TECHNETIUM TC 99M SESTAMIBI GENERIC - CARDIOLITE
10.9000 | Freq: Once | INTRAVENOUS | Status: AC | PRN
  Administered 2015-03-18: 10.9 via INTRAVENOUS

## 2015-06-20 ENCOUNTER — Other Ambulatory Visit: Payer: Self-pay | Admitting: Cardiology

## 2015-06-22 NOTE — Telephone Encounter (Signed)
Rx request sent to pharmacy.  

## 2015-07-01 ENCOUNTER — Other Ambulatory Visit: Payer: Self-pay | Admitting: Cardiology

## 2015-07-02 NOTE — Telephone Encounter (Signed)
Rx request sent to pharmacy.  

## 2015-07-20 ENCOUNTER — Other Ambulatory Visit: Payer: Self-pay | Admitting: Cardiology

## 2015-07-20 NOTE — Telephone Encounter (Signed)
REFIL 

## 2015-07-28 ENCOUNTER — Other Ambulatory Visit: Payer: Self-pay | Admitting: Cardiology

## 2015-07-28 MED ORDER — AMLODIPINE BESY-BENAZEPRIL HCL 5-20 MG PO CAPS: 1.0000 | ORAL_CAPSULE | Freq: Every day | ORAL | Status: DC

## 2015-07-30 ENCOUNTER — Other Ambulatory Visit: Payer: Self-pay | Admitting: *Deleted

## 2015-07-30 MED ORDER — AMLODIPINE BESY-BENAZEPRIL HCL 5-20 MG PO CAPS: 1.0000 | ORAL_CAPSULE | Freq: Every day | ORAL | Status: DC

## 2015-07-30 NOTE — Telephone Encounter (Signed)
REFILL 

## 2016-02-08 ENCOUNTER — Other Ambulatory Visit: Payer: Self-pay | Admitting: Cardiology

## 2016-02-08 NOTE — Telephone Encounter (Signed)
REFILL 

## 2016-02-29 ENCOUNTER — Other Ambulatory Visit: Payer: Self-pay | Admitting: Cardiology

## 2016-02-29 NOTE — Telephone Encounter (Signed)
Rx request sent to pharmacy.  

## 2016-03-22 NOTE — Progress Notes (Signed)
HPI: FU coronary artery disease. In 2003 the patient underwent coronary artery bypass graft with a LIMA to the LAD, saphenous vein graft to the second diagonal, sequential saphenous vein graft to the first diagonal and obtuse marginal and a sequential saphenous vein graft to the PDA and posterior lateral. Carotid Dopplers Oct 2016 showed 60-79% left and 40-59% right stenosis and followup recommended in one year. Nuclear study 10/16 showed EF 49; infarct with mild peri-infarct ischemia; treated medically. Since last seen the patient has dyspnea with more extreme activities but not with routine activities. It is relieved with rest. It is not associated with chest pain. There is no orthopnea, PND or pedal edema. There is no syncope or palpitations. There is no exertional chest pain.   Current Outpatient Prescriptions  Medication Sig Dispense Refill  . amLODipine-benazepril (LOTREL) 5-20 MG capsule Take 1 capsule by mouth daily. KEEP OV. 90 capsule 0  . aspirin 81 MG EC tablet Take 81 mg by mouth daily.      Marland Kitchen atorvastatin (LIPITOR) 80 MG tablet Take 1 tablet (80 mg total) by mouth daily. KEEP OV. 90 tablet 0  . calcium carbonate (OS-CAL) 600 MG TABS Take 600 mg by mouth. occasionally     . carvedilol (COREG) 25 MG tablet take 1 tablet by mouth twice a day 180 tablet 1  . hydrochlorothiazide (MICROZIDE) 12.5 MG capsule TAKE 1 CAPSULE EVERY MORNING 90 capsule 3  . Multiple Vitamin (MULTIVITAMIN) tablet 1 tab occasionally    . potassium chloride (K-DUR) 10 MEQ tablet Take 1 tablet (10 mEq total) by mouth daily. 90 tablet 1  . potassium chloride (K-DUR,KLOR-CON) 10 MEQ tablet TAKE 1 TABLET EVERY DAY 90 tablet 3   No current facility-administered medications for this visit.      Past Medical History:  Diagnosis Date  . Benign hypertension   . Carotid artery stenosis    without infarction  . Coronary atherosclerosis of autologous vein bypass graft   . Mixed hyperlipidemia     Past  Surgical History:  Procedure Laterality Date  . CATARACT EXTRACTION  june 2013  . CORONARY ARTERY BYPASS GRAFT  10/10/2001  . NOSE SURGERY    . right total hip arthroplasty  03/11/2002  . TUBAL LIGATION      Social History   Social History  . Marital status: Married    Spouse name: N/A  . Number of children: N/A  . Years of education: N/A   Occupational History  . Not on file.   Social History Main Topics  . Smoking status: Never Smoker  . Smokeless tobacco: Never Used  . Alcohol use No  . Drug use: No  . Sexual activity: Not on file   Other Topics Concern  . Not on file   Social History Narrative  . No narrative on file    Family History  Problem Relation Age of Onset  . Heart disease Father   . Heart attack Father   . Stroke Mother   . Arthritis Mother     ROS: no fevers or chills, productive cough, hemoptysis, dysphasia, odynophagia, melena, hematochezia, dysuria, hematuria, rash, seizure activity, orthopnea, PND, pedal edema, claudication. Remaining systems are negative.  Physical Exam: Well-developed well-nourished in no acute distress.  Skin is warm and dry.  HEENT is normal.  Neck is supple.  Chest is clear to auscultation with normal expansion.  Cardiovascular exam is regular rate and rhythm.  Abdominal exam nontender or distended. No masses palpated. Extremities  show no edema. neuro grossly intact  ECG-Sinus bradycardia at a rate of 49. Septal infarct. Lateral T-wave inversion.  A/P  1 Hyperlipidemia-continue statin.   2 hypertension-blood pressure controlled. Continue present medications.   3 coronary artery disease-continue aspirin and statin. Last nuclear study low risk. Plan medical therapy.   4 carotid artery disease-schedule follow-up carotid Dopplers. Continue aspirin and statin.  Olga MillersBrian Adyan Palau, MD

## 2016-03-24 ENCOUNTER — Encounter: Payer: Self-pay | Admitting: Cardiology

## 2016-03-24 ENCOUNTER — Ambulatory Visit (INDEPENDENT_AMBULATORY_CARE_PROVIDER_SITE_OTHER): Payer: Medicare HMO | Admitting: Cardiology

## 2016-03-24 VITALS — BP 116/56 | HR 49 | Ht 65.0 in | Wt 180.0 lb

## 2016-03-24 DIAGNOSIS — I10 Essential (primary) hypertension: Secondary | ICD-10-CM

## 2016-03-24 DIAGNOSIS — E7800 Pure hypercholesterolemia, unspecified: Secondary | ICD-10-CM

## 2016-03-24 DIAGNOSIS — I2510 Atherosclerotic heart disease of native coronary artery without angina pectoris: Secondary | ICD-10-CM | POA: Diagnosis not present

## 2016-03-24 DIAGNOSIS — I6523 Occlusion and stenosis of bilateral carotid arteries: Secondary | ICD-10-CM

## 2016-03-24 NOTE — Patient Instructions (Signed)
Medication Instructions:   NO CHANGE  Testing/Procedures:  Your physician has requested that you have a carotid duplex. This test is an ultrasound of the carotid arteries in your neck. It looks at blood flow through these arteries that supply the brain with blood. Allow one hour for this exam. There are no restrictions or special instructions.    Follow-Up:  Your physician wants you to follow-up in: ONE YEAR WITH DR CRENSHAW You will receive a reminder letter in the mail two months in advance. If you don't receive a letter, please call our office to schedule the follow-up appointment.   If you need a refill on your cardiac medications before your next appointment, please call your pharmacy.    

## 2016-03-30 ENCOUNTER — Ambulatory Visit (HOSPITAL_COMMUNITY)
Admission: RE | Admit: 2016-03-30 | Discharge: 2016-03-30 | Disposition: A | Discharge status: Home / Self Care | Payer: Medicare HMO | Source: Ambulatory Visit | Attending: Cardiology | Admitting: Cardiology

## 2016-03-30 DIAGNOSIS — I6523 Occlusion and stenosis of bilateral carotid arteries: Secondary | ICD-10-CM | POA: Diagnosis not present

## 2016-04-13 ENCOUNTER — Other Ambulatory Visit: Payer: Self-pay | Admitting: Cardiology

## 2016-04-22 ENCOUNTER — Other Ambulatory Visit: Payer: Self-pay | Admitting: Cardiology

## 2016-04-25 NOTE — Telephone Encounter (Signed)
Rx has been sent to the pharmacy electronically. ° °

## 2016-08-16 ENCOUNTER — Other Ambulatory Visit: Payer: Self-pay | Admitting: Cardiology

## 2017-01-10 IMAGING — NM NM MISC PROCEDURE
6 series · 36 of 36 positions shown · non-contrast
Comparison: none

[Series 1: wbr rest · 6.40mm/px · 6 of 64 frames shown]
[frame 6/64]
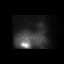
[frame 16/64]
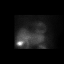
[frame 27/64]
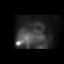
[frame 38/64]
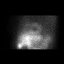
[frame 48/64]
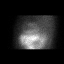
[frame 59/64]
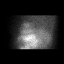

[Series 1: wbr_r-proj_st wbr rest · 6.40mm/px · 6 of 64 frames shown]
[frame 6/64]
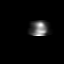
[frame 16/64]
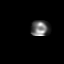
[frame 27/64]
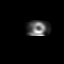
[frame 38/64]
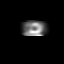
[frame 48/64]
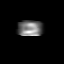
[frame 59/64]
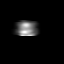

[Series 2: wbr stress-gsp · 6.40mm/px · 6 of 498 frames shown]
[frame 42/498]
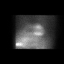
[frame 125/498]
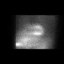
[frame 208/498]
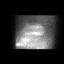
[frame 291/498]
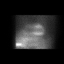
[frame 374/498]
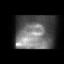
[frame 457/498]
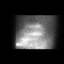

[Series 2: wbr_s-proj_st wbr stress-gsp · 6.40mm/px · 6 of 512 frames shown]
[frame 43/512]
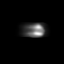
[frame 128/512]
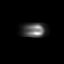
[frame 214/512]
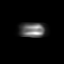
[frame 299/512]
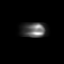
[frame 384/512]
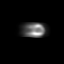
[frame 470/512]
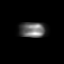

[Series 3: wbr stress-sum-em · 6.40mm/px · 6 of 64 frames shown]
[frame 6/64]
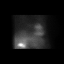
[frame 16/64]
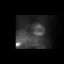
[frame 27/64]
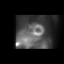
[frame 38/64]
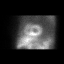
[frame 48/64]
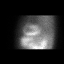
[frame 59/64]
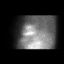

[Series 3: wbr_s-proj_st wbr stress-sum-em · 6.40mm/px · 6 of 64 frames shown]
[frame 6/64]
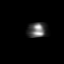
[frame 16/64]
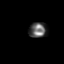
[frame 27/64]
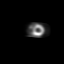
[frame 38/64]
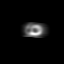
[frame 48/64]
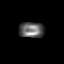
[frame 59/64]
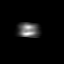

[36 of 36 positions shown; findings below may reference images not displayed]

Canned report from images found in remote index.

Refer to host system for actual result text.

## 2017-02-01 ENCOUNTER — Other Ambulatory Visit: Payer: Self-pay | Admitting: Cardiology

## 2017-03-16 ENCOUNTER — Other Ambulatory Visit: Payer: Self-pay | Admitting: *Deleted

## 2017-03-16 DIAGNOSIS — I679 Cerebrovascular disease, unspecified: Secondary | ICD-10-CM

## 2017-03-22 NOTE — Progress Notes (Signed)
HPI: FU coronary artery disease. In 2003 the patient underwent coronary artery bypass graft with a LIMA to the LAD, saphenous vein graft to the second diagonal, sequential saphenous vein graft to the first diagonal and obtuse marginal and a sequential saphenous vein graft to the PDA and posterior lateral. Nuclear study 10/16 showed EF 49; infarct with mild peri-infarct ischemia; treated medically. Carotid Dopplers November 2017 showed 40-59% right and 60-79% left stenosis. Since last seen the patient has dyspnea with more extreme activities but not with routine activities. It is relieved with rest. It is not associated with chest pain. There is no orthopnea, PND or pedal edema. There is no syncope or palpitations. There is no exertional chest pain.   Current Outpatient Prescriptions  Medication Sig Dispense Refill  . amLODipine-benazepril (LOTREL) 5-20 MG capsule TAKE 1 CAPSULE EVERY DAY.  KEEP  OFFICE  VISIT 90 capsule 0  . aspirin 81 MG EC tablet Take 81 mg by mouth daily.      Marland Kitchen atorvastatin (LIPITOR) 80 MG tablet TAKE 1 TABLET EVERY DAY  (KEEP  OFFICE  VISIT) 90 tablet 0  . calcium carbonate (OS-CAL) 600 MG TABS Take 600 mg by mouth. occasionally     . carvedilol (COREG) 25 MG tablet take 1 tablet by mouth twice a day 180 tablet 3  . hydrochlorothiazide (MICROZIDE) 12.5 MG capsule TAKE 1 CAPSULE EVERY MORNING 90 capsule 0  . Multiple Vitamin (MULTIVITAMIN) tablet 1 tab occasionally    . potassium chloride (K-DUR) 10 MEQ tablet Take 1 tablet (10 mEq total) by mouth daily. 90 tablet 1  . potassium chloride (K-DUR,KLOR-CON) 10 MEQ tablet TAKE 1 TABLET EVERY DAY 90 tablet 0   No current facility-administered medications for this visit.      Past Medical History:  Diagnosis Date  . Benign hypertension   . Carotid artery stenosis    without infarction  . Coronary atherosclerosis of autologous vein bypass graft   . Mixed hyperlipidemia     Past Surgical History:  Procedure  Laterality Date  . CATARACT EXTRACTION  june 2013  . CORONARY ARTERY BYPASS GRAFT  10/10/2001  . NOSE SURGERY    . right total hip arthroplasty  03/11/2002  . TUBAL LIGATION      Social History   Social History  . Marital status: Married    Spouse name: N/A  . Number of children: N/A  . Years of education: N/A   Occupational History  . Not on file.   Social History Main Topics  . Smoking status: Never Smoker  . Smokeless tobacco: Never Used  . Alcohol use No  . Drug use: No  . Sexual activity: Not on file   Other Topics Concern  . Not on file   Social History Narrative  . No narrative on file    Family History  Problem Relation Age of Onset  . Heart disease Father   . Heart attack Father   . Stroke Mother   . Arthritis Mother     ROS: Recent gout but no fevers or chills, productive cough, hemoptysis, dysphasia, odynophagia, melena, hematochezia, dysuria, hematuria, rash, seizure activity, orthopnea, PND, pedal edema, claudication. Remaining systems are negative.  Physical Exam: Well-developed well-nourished in no acute distress.  Skin is warm and dry.  HEENT is normal.  Neck is supple.  Chest is clear to auscultation with normal expansion.  Cardiovascular exam is regular rate and rhythm.  Abdominal exam nontender or distended. No masses palpated. Extremities  show no edema. neuro grossly intact  ECG- Sinus rhythm at a rate of 87. Prior anterior infarct. Low voltage. personally reviewed  A/P  1 Coronary artery disease-patient doing well with no chest pain. Continue aspirin and statin.  2 hypertension-blood pressure is controlled. Continue present medications. K and renal function followed by primary care.  3 hyperlipidemia-continue statin.   4 carotid artery disease-continue aspirin and statin. Follow-up carotid Dopplers today.  Olga MillersBrian Crenshaw, MD

## 2017-03-30 ENCOUNTER — Ambulatory Visit (INDEPENDENT_AMBULATORY_CARE_PROVIDER_SITE_OTHER): Payer: Medicare HMO | Admitting: Cardiology

## 2017-03-30 ENCOUNTER — Encounter: Payer: Self-pay | Admitting: Cardiology

## 2017-03-30 ENCOUNTER — Encounter (HOSPITAL_COMMUNITY): Payer: Medicare HMO

## 2017-03-30 ENCOUNTER — Ambulatory Visit (HOSPITAL_COMMUNITY)
Admission: RE | Admit: 2017-03-30 | Discharge: 2017-03-30 | Disposition: A | Discharge status: Home / Self Care | Payer: Medicare HMO | Source: Ambulatory Visit | Attending: Internal Medicine | Admitting: Internal Medicine

## 2017-03-30 VITALS — BP 132/66 | HR 87 | Ht 63.0 in | Wt 179.0 lb

## 2017-03-30 DIAGNOSIS — E7800 Pure hypercholesterolemia, unspecified: Secondary | ICD-10-CM

## 2017-03-30 DIAGNOSIS — E785 Hyperlipidemia, unspecified: Secondary | ICD-10-CM | POA: Insufficient documentation

## 2017-03-30 DIAGNOSIS — I679 Cerebrovascular disease, unspecified: Secondary | ICD-10-CM | POA: Insufficient documentation

## 2017-03-30 DIAGNOSIS — I739 Peripheral vascular disease, unspecified: Secondary | ICD-10-CM | POA: Insufficient documentation

## 2017-03-30 DIAGNOSIS — I10 Essential (primary) hypertension: Secondary | ICD-10-CM

## 2017-03-30 DIAGNOSIS — I2510 Atherosclerotic heart disease of native coronary artery without angina pectoris: Secondary | ICD-10-CM | POA: Diagnosis not present

## 2017-03-30 DIAGNOSIS — I6523 Occlusion and stenosis of bilateral carotid arteries: Secondary | ICD-10-CM | POA: Insufficient documentation

## 2017-03-30 DIAGNOSIS — I25810 Atherosclerosis of coronary artery bypass graft(s) without angina pectoris: Secondary | ICD-10-CM

## 2017-03-30 NOTE — Patient Instructions (Signed)
Your physician wants you to follow-up in: ONE YEAR WITH DR CRENSHAW You will receive a reminder letter in the mail two months in advance. If you don't receive a letter, please call our office to schedule the follow-up appointment.   If you need a refill on your cardiac medications before your next appointment, please call your pharmacy.  

## 2017-06-21 DIAGNOSIS — N1832 Chronic kidney disease, stage 3b: Secondary | ICD-10-CM | POA: Diagnosis present

## 2017-07-14 ENCOUNTER — Other Ambulatory Visit: Payer: Self-pay | Admitting: Cardiology

## 2017-07-14 NOTE — Telephone Encounter (Signed)
REFILL 

## 2017-09-11 ENCOUNTER — Other Ambulatory Visit: Payer: Self-pay | Admitting: Cardiology

## 2017-11-03 ENCOUNTER — Other Ambulatory Visit: Payer: Self-pay | Admitting: *Deleted

## 2017-11-03 DIAGNOSIS — I6523 Occlusion and stenosis of bilateral carotid arteries: Secondary | ICD-10-CM

## 2017-11-03 DIAGNOSIS — I679 Cerebrovascular disease, unspecified: Secondary | ICD-10-CM

## 2017-12-18 ENCOUNTER — Other Ambulatory Visit: Payer: Self-pay | Admitting: Cardiology

## 2018-02-17 ENCOUNTER — Other Ambulatory Visit: Payer: Self-pay | Admitting: Cardiology

## 2018-04-05 NOTE — Progress Notes (Signed)
HPI: FU coronary artery disease. In 2003 the patient underwent coronary artery bypass graft with a LIMA to the LAD, saphenous vein graft to the second diagonal, sequential saphenous vein graft to the first diagonal and obtuse marginal and a sequential saphenous vein graft to the PDA and posterior lateral. Nuclear study 10/16 showed EF 49; infarct with mild peri-infarct ischemia; treated medically. Carotid Dopplers November 2018 showed 40-59% right and 60-79% left stenosis. Since last seen the patient has dyspnea with more extreme activities but not with routine activities. It is relieved with rest. It is not associated with chest pain. There is no orthopnea, PND or pedal edema. There is no syncope or palpitations. There is no exertional chest pain.   Current Outpatient Medications  Medication Sig Dispense Refill  . amLODipine-benazepril (LOTREL) 5-20 MG capsule TAKE 1 CAPSULE EVERY DAY.  KEEP  OFFICE  VISIT 90 capsule 1  . aspirin 81 MG EC tablet Take 81 mg by mouth daily.      Marland Kitchen atorvastatin (LIPITOR) 80 MG tablet TAKE 1 TABLET EVERY DAY  (KEEP  OFFICE  VISIT) 90 tablet 1  . calcium carbonate (OS-CAL) 600 MG TABS Take 600 mg by mouth. occasionally     . carvedilol (COREG) 25 MG tablet take 1 tablet by mouth twice a day 180 tablet 3  . hydrochlorothiazide (MICROZIDE) 12.5 MG capsule TAKE 1 CAPSULE EVERY MORNING 90 capsule 0  . Multiple Vitamin (MULTIVITAMIN) tablet 1 tab occasionally    . potassium chloride (K-DUR) 10 MEQ tablet Take 1 tablet (10 mEq total) by mouth daily. 90 tablet 1  . potassium chloride (K-DUR,KLOR-CON) 10 MEQ tablet TAKE 1 TABLET EVERY DAY 90 tablet 0   No current facility-administered medications for this visit.      Past Medical History:  Diagnosis Date  . Benign hypertension   . Carotid artery stenosis    without infarction  . Coronary atherosclerosis of autologous vein bypass graft   . Mixed hyperlipidemia     Past Surgical History:  Procedure  Laterality Date  . CATARACT EXTRACTION  june 2013  . CORONARY ARTERY BYPASS GRAFT  10/10/2001  . NOSE SURGERY    . right total hip arthroplasty  03/11/2002  . TUBAL LIGATION      Social History   Socioeconomic History  . Marital status: Married    Spouse name: Not on file  . Number of children: Not on file  . Years of education: Not on file  . Highest education level: Not on file  Occupational History  . Not on file  Social Needs  . Financial resource strain: Not on file  . Food insecurity:    Worry: Not on file    Inability: Not on file  . Transportation needs:    Medical: Not on file    Non-medical: Not on file  Tobacco Use  . Smoking status: Never Smoker  . Smokeless tobacco: Never Used  Substance and Sexual Activity  . Alcohol use: No  . Drug use: No  . Sexual activity: Not on file  Lifestyle  . Physical activity:    Days per week: Not on file    Minutes per session: Not on file  . Stress: Not on file  Relationships  . Social connections:    Talks on phone: Not on file    Gets together: Not on file    Attends religious service: Not on file    Active member of club or organization: Not on file  Attends meetings of clubs or organizations: Not on file    Relationship status: Not on file  . Intimate partner violence:    Fear of current or ex partner: Not on file    Emotionally abused: Not on file    Physically abused: Not on file    Forced sexual activity: Not on file  Other Topics Concern  . Not on file  Social History Narrative  . Not on file    Family History  Problem Relation Age of Onset  . Heart disease Father   . Heart attack Father   . Stroke Mother   . Arthritis Mother     ROS: no fevers or chills, productive cough, hemoptysis, dysphasia, odynophagia, melena, hematochezia, dysuria, hematuria, rash, seizure activity, orthopnea, PND, pedal edema, claudication. Remaining systems are negative.  Physical Exam: Well-developed well-nourished in  no acute distress.  Skin is warm and dry.  HEENT is normal.  Neck is supple.  Chest is clear to auscultation with normal expansion.  Cardiovascular exam is regular rate and rhythm.  Abdominal exam nontender or distended. No masses palpated. Extremities show no edema. neuro grossly intact  ECG-sinus bradycardia at a rate of 52, septal infarct, nonspecific ST changes, first-degree AV block.  Personally reviewed  A/P  1 coronary artery disease-patient denies chest pain.  Continue medical therapy with aspirin and statin.  2 hypertension-patient's blood pressure is controlled.  Continue present medications and follow.  3 hyperlipidemia-continue statin. Lipids and liver followed by primary care.  4 carotid artery disease-schedule follow-up carotid Dopplers.  Continue aspirin and statin.  Olga Millers, MD

## 2018-04-17 ENCOUNTER — Ambulatory Visit (INDEPENDENT_AMBULATORY_CARE_PROVIDER_SITE_OTHER): Payer: Medicare HMO | Admitting: Cardiology

## 2018-04-17 ENCOUNTER — Encounter: Payer: Self-pay | Admitting: Cardiology

## 2018-04-17 VITALS — BP 126/68 | HR 52 | Ht 60.0 in | Wt 176.6 lb

## 2018-04-17 DIAGNOSIS — I2510 Atherosclerotic heart disease of native coronary artery without angina pectoris: Secondary | ICD-10-CM | POA: Diagnosis not present

## 2018-04-17 DIAGNOSIS — I25810 Atherosclerosis of coronary artery bypass graft(s) without angina pectoris: Secondary | ICD-10-CM | POA: Diagnosis not present

## 2018-04-17 DIAGNOSIS — E7800 Pure hypercholesterolemia, unspecified: Secondary | ICD-10-CM

## 2018-04-17 DIAGNOSIS — I10 Essential (primary) hypertension: Secondary | ICD-10-CM | POA: Diagnosis not present

## 2018-04-17 DIAGNOSIS — I679 Cerebrovascular disease, unspecified: Secondary | ICD-10-CM | POA: Diagnosis not present

## 2018-04-17 NOTE — Patient Instructions (Signed)
Medication Instructions:  NO CHANGE If you need a refill on your cardiac medications before your next appointment, please call your pharmacy.   Lab work: If you have labs (blood work) drawn today and your tests are completely normal, you will receive your results only by: Marland Kitchen. MyChart Message (if you have MyChart) OR . A paper copy in the mail If you have any lab test that is abnormal or we need to change your treatment, we will call you to review the results.  Testing/Procedures: Your physician has requested that you have a carotid duplex. This test is an ultrasound of the carotid arteries in your neck. It looks at blood flow through these arteries that supply the brain with blood. Allow one hour for this exam. There are no restrictions or special instructions.    Follow-Up: At [facility], you and your health needs are our priority.  As part of our continuing mission to provide you with exceptional heart care, we have created designated Provider Care Teams.  These Care Teams include your primary Cardiologist (physician) and Advanced Practice Providers (APPs -  Physician Assistants and Nurse Practitioners) who all work together to provide you with the care you need, when you need it. You will need a follow up appointment in 12 months.  Please call our office 2 months in advance to schedule this appointment.  You may see Olga MillersBRIAN CRENSHAW MD or one of the following Advanced Practice Providers on your designated Care Team:   Corine ShelterLuke Kilroy, PA-C Judy PimpleKrista Kroeger, [place]PA-C . Marjie Skiffallie Goodrich, PA-C

## 2018-04-24 ENCOUNTER — Other Ambulatory Visit: Payer: Self-pay | Admitting: Cardiology

## 2018-04-24 DIAGNOSIS — I2510 Atherosclerotic heart disease of native coronary artery without angina pectoris: Secondary | ICD-10-CM

## 2018-04-24 DIAGNOSIS — I6523 Occlusion and stenosis of bilateral carotid arteries: Secondary | ICD-10-CM

## 2018-05-02 ENCOUNTER — Ambulatory Visit (HOSPITAL_COMMUNITY)
Admission: RE | Admit: 2018-05-02 | Discharge: 2018-05-02 | Disposition: A | Discharge status: Home / Self Care | Payer: Medicare HMO | Source: Ambulatory Visit | Attending: Cardiology | Admitting: Cardiology

## 2018-05-02 DIAGNOSIS — I6523 Occlusion and stenosis of bilateral carotid arteries: Secondary | ICD-10-CM | POA: Insufficient documentation

## 2018-05-02 DIAGNOSIS — I2510 Atherosclerotic heart disease of native coronary artery without angina pectoris: Secondary | ICD-10-CM

## 2018-05-07 ENCOUNTER — Other Ambulatory Visit: Payer: Self-pay | Admitting: *Deleted

## 2018-05-07 DIAGNOSIS — I6523 Occlusion and stenosis of bilateral carotid arteries: Secondary | ICD-10-CM

## 2018-07-02 ENCOUNTER — Other Ambulatory Visit: Payer: Self-pay | Admitting: Cardiology

## 2018-07-17 ENCOUNTER — Other Ambulatory Visit: Payer: Self-pay | Admitting: Cardiology

## 2018-07-17 NOTE — Telephone Encounter (Signed)
Rx request sent to pharmacy.  

## 2018-08-03 ENCOUNTER — Other Ambulatory Visit: Payer: Self-pay | Admitting: Cardiology

## 2019-02-07 ENCOUNTER — Other Ambulatory Visit: Payer: Self-pay | Admitting: Cardiology

## 2019-04-15 ENCOUNTER — Other Ambulatory Visit: Payer: Self-pay | Admitting: Cardiology

## 2019-04-22 ENCOUNTER — Other Ambulatory Visit: Payer: Self-pay | Admitting: Cardiology

## 2019-04-29 ENCOUNTER — Other Ambulatory Visit: Payer: Self-pay

## 2019-04-29 MED ORDER — CARVEDILOL 25 MG PO TABS: 25.0000 mg | ORAL_TABLET | Freq: Two times a day (BID) | ORAL | 1 refills | Status: DC

## 2019-05-09 NOTE — Progress Notes (Signed)
HPI: FU coronary artery disease. In 2003 the patient underwent coronary artery bypass graft with a LIMA to the LAD, saphenous vein graft to the second diagonal, sequential saphenous vein graft to the first diagonal and obtuse marginal and a sequential saphenous vein graft to the PDA and posterior lateral. Nuclear study 10/16 showed EF 49; infarct with mild peri-infarct ischemia; treated medically.Carotid Dopplers December 2019 showed 1 to 39% right and 40 to 59% left stenosis.  Sincelast seenshe denies dyspnea, chest pain, palpitations or syncope.  Current Outpatient Medications  Medication Sig Dispense Refill  . amLODipine-benazepril (LOTREL) 5-20 MG capsule TAKE 1 CAPSULE EVERY DAY 90 capsule 3  . aspirin 81 MG EC tablet Take 81 mg by mouth daily.      Marland Kitchen atorvastatin (LIPITOR) 80 MG tablet TAKE 1 TABLET EVERY DAY  AT  6PM 90 tablet 0  . calcium carbonate (OS-CAL) 600 MG TABS Take 600 mg by mouth. occasionally     . carvedilol (COREG) 25 MG tablet Take 1 tablet (25 mg total) by mouth 2 (two) times daily. 180 tablet 1  . hydrochlorothiazide (MICROZIDE) 12.5 MG capsule TAKE 1 CAPSULE EVERY MORNING 90 capsule 3  . Multiple Vitamin (MULTIVITAMIN) tablet 1 tab occasionally    . potassium chloride (K-DUR) 10 MEQ tablet Take 1 tablet (10 mEq total) by mouth daily. 90 tablet 1  . potassium chloride (KLOR-CON) 10 MEQ tablet TAKE 1 TABLET EVERY DAY 90 tablet 3   No current facility-administered medications for this visit.     Past Medical History:  Diagnosis Date  . Benign hypertension   . Carotid artery stenosis    without infarction  . Coronary atherosclerosis of autologous vein bypass graft   . Mixed hyperlipidemia     Past Surgical History:  Procedure Laterality Date  . CATARACT EXTRACTION  june 2013  . CORONARY ARTERY BYPASS GRAFT  10/10/2001  . NOSE SURGERY    . right total hip arthroplasty  03/11/2002  . TUBAL LIGATION      Social History   Socioeconomic History  .  Marital status: Married    Spouse name: Not on file  . Number of children: Not on file  . Years of education: Not on file  . Highest education level: Not on file  Occupational History  . Not on file  Tobacco Use  . Smoking status: Never Smoker  . Smokeless tobacco: Never Used  Substance and Sexual Activity  . Alcohol use: No  . Drug use: No  . Sexual activity: Not on file  Other Topics Concern  . Not on file  Social History Narrative  . Not on file   Social Determinants of Health   Financial Resource Strain:   . Difficulty of Paying Living Expenses: Not on file  Food Insecurity:   . Worried About Charity fundraiser in the Last Year: Not on file  . Ran Out of Food in the Last Year: Not on file  Transportation Needs:   . Lack of Transportation (Medical): Not on file  . Lack of Transportation (Non-Medical): Not on file  Physical Activity:   . Days of Exercise per Week: Not on file  . Minutes of Exercise per Session: Not on file  Stress:   . Feeling of Stress : Not on file  Social Connections:   . Frequency of Communication with Friends and Family: Not on file  . Frequency of Social Gatherings with Friends and Family: Not on file  . Attends  Religious Services: Not on file  . Active Member of Clubs or Organizations: Not on file  . Attends Banker Meetings: Not on file  . Marital Status: Not on file  Intimate Partner Violence:   . Fear of Current or Ex-Partner: Not on file  . Emotionally Abused: Not on file  . Physically Abused: Not on file  . Sexually Abused: Not on file    Family History  Problem Relation Age of Onset  . Heart disease Father   . Heart attack Father   . Stroke Mother   . Arthritis Mother     ROS: no fevers or chills, productive cough, hemoptysis, dysphasia, odynophagia, melena, hematochezia, dysuria, hematuria, rash, seizure activity, orthopnea, PND, pedal edema, claudication. Remaining systems are negative.  Physical Exam:  Well-developed well-nourished in no acute distress.  Skin is warm and dry.  HEENT is normal.  Neck is supple.  Chest is clear to auscultation with normal expansion.  Cardiovascular exam is regular rate and rhythm.  Abdominal exam nontender or distended. No masses palpated. Extremities show no edema. neuro grossly intact  ECG-sinus bradycardia at a rate of 58, first-degree AV block, cannot rule out septal infarct, nonspecific ST changes.  Personally reviewed  A/P  1 coronary artery disease-patient has not had chest pain.  Plan to continue present dose of aspirin and statin.  2 carotid artery disease-schedule follow-up carotid Dopplers.  3 hypertension-patient's blood pressure is elevated; I have asked her to follow this and we will advance medications as needed.  4 hyperlipidemia-continue statin.  Olga Millers, MD

## 2019-05-13 ENCOUNTER — Ambulatory Visit (HOSPITAL_COMMUNITY)
Admission: RE | Admit: 2019-05-13 | Payer: Medicare HMO | Source: Ambulatory Visit | Attending: Cardiology | Admitting: Cardiology

## 2019-05-13 ENCOUNTER — Other Ambulatory Visit: Payer: Self-pay

## 2019-05-13 ENCOUNTER — Ambulatory Visit (INDEPENDENT_AMBULATORY_CARE_PROVIDER_SITE_OTHER): Payer: Medicare HMO | Admitting: Cardiology

## 2019-05-13 ENCOUNTER — Encounter: Payer: Self-pay | Admitting: Cardiology

## 2019-05-13 ENCOUNTER — Encounter (INDEPENDENT_AMBULATORY_CARE_PROVIDER_SITE_OTHER): Payer: Self-pay

## 2019-05-13 VITALS — BP 152/67 | HR 58 | Temp 97.1°F | Ht 65.0 in | Wt 179.3 lb

## 2019-05-13 DIAGNOSIS — I10 Essential (primary) hypertension: Secondary | ICD-10-CM | POA: Diagnosis not present

## 2019-05-13 DIAGNOSIS — E7800 Pure hypercholesterolemia, unspecified: Secondary | ICD-10-CM | POA: Diagnosis not present

## 2019-05-13 DIAGNOSIS — I25810 Atherosclerosis of coronary artery bypass graft(s) without angina pectoris: Secondary | ICD-10-CM

## 2019-05-13 DIAGNOSIS — I6523 Occlusion and stenosis of bilateral carotid arteries: Secondary | ICD-10-CM

## 2019-05-13 NOTE — Patient Instructions (Signed)
Medication Instructions:  NO CHANGE *If you need a refill on your cardiac medications before your next appointment, please call your pharmacy*  Lab Work: If you have labs (blood work) drawn today and your tests are completely normal, you will receive your results only by: Marland Kitchen MyChart Message (if you have MyChart) OR . A paper copy in the mail If you have any lab test that is abnormal or we need to change your treatment, we will call you to review the results.  Testing/Procedures: Your physician has requested that you have a carotid duplex. This test is an ultrasound of the carotid arteries in your neck. It looks at blood flow through these arteries that supply the brain with blood. Allow one hour for this exam. There are no restrictions or special instructions.[facility]    Follow-Up: At [facility], you and your health needs are our priority.  As part of our continuing mission to provide you with exceptional heart care, we have created designated Provider Care Teams.  These Care Teams include your primary Cardiologist (physician) and Advanced Practice Providers (APPs -  Physician Assistants and Nurse Practitioners) who all work together to provide you with the care you need, when you need it.  Your next appointment:   12 month(s)  The format for your next appointment:   Either In Person or Virtual  Provider:   You may see Kirk Ruths MD or one of the following Advanced Practice Providers on your designated Care Team:    Kerin Ransom, PA-C  [place], [place]  Coletta Memos, [place]

## 2019-06-28 ENCOUNTER — Other Ambulatory Visit: Payer: Self-pay | Admitting: Cardiology

## 2019-06-28 NOTE — Telephone Encounter (Signed)
Rx request sent to pharmacy.  

## 2019-11-18 ENCOUNTER — Other Ambulatory Visit: Payer: Self-pay

## 2019-11-18 MED ORDER — CARVEDILOL 25 MG PO TABS: 25.0000 mg | ORAL_TABLET | Freq: Two times a day (BID) | ORAL | 1 refills | Status: DC

## 2019-12-12 ENCOUNTER — Other Ambulatory Visit: Payer: Self-pay | Admitting: Cardiology

## 2020-05-18 ENCOUNTER — Other Ambulatory Visit: Payer: Self-pay | Admitting: Cardiology

## 2020-05-20 ENCOUNTER — Telehealth: Payer: Self-pay | Admitting: Cardiology

## 2020-05-20 MED ORDER — ATORVASTATIN CALCIUM 80 MG PO TABS: ORAL_TABLET | ORAL | 0 refills | Status: DC

## 2020-05-20 MED ORDER — HYDROCHLOROTHIAZIDE 12.5 MG PO CAPS: 12.5000 mg | ORAL_CAPSULE | Freq: Every morning | ORAL | 0 refills | Status: DC

## 2020-05-20 NOTE — Telephone Encounter (Signed)
   *  STAT* If patient is at the pharmacy, call can be transferred to refill team.   1. Which medications need to be refilled? (please list name of each medication and dose if known) atorvastatin (LIPITOR) 80 MG tablet  hydrochlorothiazide (MICROZIDE) 12.5 MG capsule    2. Which pharmacy/location (including street and city if local pharmacy) is medication to be sent to? [address], [place], [place] [address]  3. Do they need a 30 day or 90 day supply? 90 days  Pt completely our of medications

## 2020-06-01 ENCOUNTER — Other Ambulatory Visit: Payer: Self-pay

## 2020-06-01 MED ORDER — CARVEDILOL 25 MG PO TABS: ORAL_TABLET | ORAL | 0 refills | Status: DC

## 2020-06-01 MED ORDER — ATORVASTATIN CALCIUM 80 MG PO TABS: ORAL_TABLET | ORAL | 0 refills | Status: DC

## 2020-06-01 MED ORDER — HYDROCHLOROTHIAZIDE 12.5 MG PO CAPS: 12.5000 mg | ORAL_CAPSULE | Freq: Every morning | ORAL | 0 refills | Status: DC

## 2020-06-01 NOTE — Progress Notes (Signed)
Refill authorizations requested by [facility] for atorvastatin, carvedilol and microzide.

## 2020-06-30 ENCOUNTER — Other Ambulatory Visit: Payer: Self-pay | Admitting: Cardiology

## 2020-07-01 ENCOUNTER — Telehealth: Payer: Self-pay | Admitting: Cardiology

## 2020-07-01 MED ORDER — POTASSIUM CHLORIDE CRYS ER 10 MEQ PO TBCR: 10.0000 meq | EXTENDED_RELEASE_TABLET | Freq: Every day | ORAL | 0 refills | Status: DC

## 2020-07-01 NOTE — Telephone Encounter (Signed)
*  STAT* If patient is at the pharmacy, call can be transferred to refill team.   1. Which medications need to be refilled? (please list name of each medication and dose if known)  Need a prescription for local pharmacy until her mail order arrives- Potassium Chloride  2. Which pharmacy/location (including street and city if local pharmacy) is medication to be sent to? [facility]- [phone]  3. Do they need a 30 day or 90 day supply? 15 days enough until her mail order arrives

## 2020-07-01 NOTE — Telephone Encounter (Signed)
*  STAT* If patient is at the pharmacy, call can be transferred to refill team.   1. Which medications need to be refilled? (please list name of each medication and dose if known) Potassium Cloride  2. Which pharmacy/location (including street and city if local pharmacy) is medication to be sent to? [facility]  3. Do they need a 30 day or 90 day supply? 90 days and refills

## 2020-07-03 ENCOUNTER — Other Ambulatory Visit (HOSPITAL_COMMUNITY): Payer: Self-pay | Admitting: Cardiology

## 2020-07-03 DIAGNOSIS — I6523 Occlusion and stenosis of bilateral carotid arteries: Secondary | ICD-10-CM

## 2020-07-03 MED ORDER — POTASSIUM CHLORIDE ER 10 MEQ PO TBCR: 10.0000 meq | EXTENDED_RELEASE_TABLET | Freq: Every day | ORAL | 3 refills | Status: DC

## 2020-07-03 NOTE — Addendum Note (Signed)
Addended by: Teressa Senter on: 07/03/2020 10:56 AM   Modules accepted: Orders

## 2020-07-03 NOTE — Telephone Encounter (Signed)
Spoke with patient, patient was told by [facility] that Dr. Jens Som wasn't refilling her potassium. 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 No longer having gout. States if Dr. Jens Som wants her to take potassium she will, she just thought she'd mention it. She is under a lot of stress due to her husband passing away recently. Informed patient potassium will be resent to [facility]. Patient looking forward to next visit with Dr. Jens Som.

## 2020-07-03 NOTE — Telephone Encounter (Signed)
Pt c/o medication issue:  1. Name of Medication: potassium chloride (KLOR-CON) 10 MEQ tablet  2. How are you currently taking this medication (dosage and times per day)? 1 tablet (10 mEq total) by mouth daily  3. Are you having a reaction (difficulty breathing--STAT)? No   4. What is your medication issue? Patient is following up. Per patient, her PCP states this medication can trigger gout and she would like to know if she should discontinue. Please advise.

## 2020-07-28 ENCOUNTER — Other Ambulatory Visit: Payer: Self-pay | Admitting: Cardiology

## 2020-07-29 ENCOUNTER — Other Ambulatory Visit: Payer: Self-pay

## 2020-07-29 MED ORDER — POTASSIUM CHLORIDE CRYS ER 10 MEQ PO TBCR: 10.0000 meq | EXTENDED_RELEASE_TABLET | Freq: Every day | ORAL | 1 refills | Status: DC

## 2020-08-11 ENCOUNTER — Other Ambulatory Visit (HOSPITAL_COMMUNITY): Payer: Self-pay | Admitting: Cardiology

## 2020-08-11 ENCOUNTER — Other Ambulatory Visit: Payer: Self-pay

## 2020-08-11 ENCOUNTER — Ambulatory Visit (HOSPITAL_COMMUNITY)
Admission: RE | Admit: 2020-08-11 | Discharge: 2020-08-11 | Disposition: A | Discharge status: Home / Self Care | Payer: Medicare HMO | Source: Ambulatory Visit | Attending: Cardiovascular Disease | Admitting: Cardiovascular Disease

## 2020-08-11 DIAGNOSIS — I6523 Occlusion and stenosis of bilateral carotid arteries: Secondary | ICD-10-CM | POA: Insufficient documentation

## 2020-08-12 ENCOUNTER — Encounter: Payer: Self-pay | Admitting: *Deleted

## 2020-08-15 ENCOUNTER — Other Ambulatory Visit: Payer: Self-pay | Admitting: Cardiology

## 2020-08-24 NOTE — Progress Notes (Signed)
HPI: FU coronary artery disease. In 2003 the patient underwent coronary artery bypass graft with a LIMA to the LAD, saphenous vein graft to the second diagonal, sequential saphenous vein graft to the first diagonal and obtuse marginal and a sequential saphenous vein graft to the PDA and posterior lateral. Nuclear study 10/16 showed EF 49; infarct with mild peri-infarct ischemia; treated medically.Carotid Dopplers 3/22 showed 1 to 39% right and 40 to 59% left stenosis.  Sincelast seenshe denies dyspnea, chest pain, palpitations or syncope.  Current Outpatient Medications  Medication Sig Dispense Refill  . aspirin 81 MG EC tablet Take 81 mg by mouth daily.    Marland Kitchen atorvastatin (LIPITOR) 80 MG tablet TAKE 1 TABLET BY MOUTH EVERY DAY AT 6 PM 30 tablet 0  . calcium carbonate (OS-CAL) 600 MG TABS Take 600 mg by mouth. occasionally    . carvedilol (COREG) 25 MG tablet TAKE 1 TABLET(25 MG) BY MOUTH TWICE DAILY 180 tablet 0  . hydrochlorothiazide (MICROZIDE) 12.5 MG capsule TAKE 1 CAPSULE(12.5 MG) BY MOUTH EVERY MORNING 30 capsule 0  . Multiple Vitamin (MULTIVITAMIN) tablet 1 tab occasionally    . potassium chloride (KLOR-CON) 10 MEQ tablet Take 1 tablet (10 mEq total) by mouth daily. 90 tablet 3  . amLODipine-benazepril (LOTREL) 5-20 MG capsule TAKE 1 CAPSULE EVERY DAY 90 capsule 3   No current facility-administered medications for this visit.     Past Medical History:  Diagnosis Date  . Benign hypertension   . Carotid artery stenosis    without infarction  . Coronary atherosclerosis of autologous vein bypass graft   . Mixed hyperlipidemia     Past Surgical History:  Procedure Laterality Date  . CATARACT EXTRACTION  june 2013  . CORONARY ARTERY BYPASS GRAFT  10/10/2001  . NOSE SURGERY    . right total hip arthroplasty  03/11/2002  . TUBAL LIGATION      Social History   Socioeconomic History  . Marital status: Married    Spouse name: Not on file  . Number of children: Not on  file  . Years of education: Not on file  . Highest education level: Not on file  Occupational History  . Not on file  Tobacco Use  . Smoking status: Never Smoker  . Smokeless tobacco: Never Used  Substance and Sexual Activity  . Alcohol use: No  . Drug use: No  . Sexual activity: Not on file  Other Topics Concern  . Not on file  Social History Narrative  . Not on file   Social Determinants of Health   Financial Resource Strain: Not on file  Food Insecurity: Not on file  Transportation Needs: Not on file  Physical Activity: Not on file  Stress: Not on file  Social Connections: Not on file  Intimate Partner Violence: Not on file    Family History  Problem Relation Age of Onset  . Heart disease Father   . Heart attack Father   . Stroke Mother   . Arthritis Mother     ROS: no fevers or chills, productive cough, hemoptysis, dysphasia, odynophagia, melena, hematochezia, dysuria, hematuria, rash, seizure activity, orthopnea, PND, pedal edema, claudication. Remaining systems are negative.  Physical Exam: Well-developed well-nourished in no acute distress.  Skin is warm and dry.  HEENT is normal.  Neck is supple.  Chest is clear to auscultation with normal expansion.  Cardiovascular exam is regular rate and rhythm.  Abdominal exam nontender or distended. No masses palpated. Extremities show no  edema. neuro grossly intact  ECG-sinus bradycardia at a rate of 56, normal axis, cannot rule out septal infarct, nonspecific ST changes.  Personally reviewed  A/P  1 coronary artery disease-patient denies chest pain.  Continue aspirin and statin.  2 carotid artery disease-she will need follow-up carotid Dopplers March 2023.  3 hyperlipidemia-continue statin.  Check lipids and liver.  4 hypertension-blood pressure elevated; change Lotrel to 10/20 mg daily.  Follow blood pressure and adjust medications as needed.  Check potassium and renal function.  Olga Millers, MD

## 2020-08-27 ENCOUNTER — Ambulatory Visit: Payer: Medicare HMO | Admitting: Cardiology

## 2020-08-27 ENCOUNTER — Other Ambulatory Visit: Payer: Self-pay

## 2020-08-27 ENCOUNTER — Encounter: Payer: Self-pay | Admitting: Cardiology

## 2020-08-27 VITALS — BP 170/78 | HR 56 | Ht 65.0 in | Wt 162.4 lb

## 2020-08-27 DIAGNOSIS — I10 Essential (primary) hypertension: Secondary | ICD-10-CM

## 2020-08-27 DIAGNOSIS — I6523 Occlusion and stenosis of bilateral carotid arteries: Secondary | ICD-10-CM | POA: Diagnosis not present

## 2020-08-27 DIAGNOSIS — I25810 Atherosclerosis of coronary artery bypass graft(s) without angina pectoris: Secondary | ICD-10-CM

## 2020-08-27 DIAGNOSIS — E7800 Pure hypercholesterolemia, unspecified: Secondary | ICD-10-CM | POA: Diagnosis not present

## 2020-08-27 MED ORDER — AMLODIPINE BESY-BENAZEPRIL HCL 10-20 MG PO CAPS: 1.0000 | ORAL_CAPSULE | Freq: Every day | ORAL | 3 refills | Status: DC

## 2020-08-27 NOTE — Patient Instructions (Signed)
Medication Instructions:   INCREASE AMLODIPINE-BENAZEPRIL TO 10-20 MG ONCE DAILY  *If you need a refill on your cardiac medications before your next appointment, please call your pharmacy*   Lab Work:  Your physician recommends that you return for lab work FASTING  If you have labs (blood work) drawn today and your tests are completely normal, you will receive your results only by: Marland Kitchen MyChart Message (if you have MyChart) OR . A paper copy in the mail If you have any lab test that is abnormal or we need to change your treatment, we will call you to review the results.   Follow-Up: At [facility], you and your health needs are our priority.  As part of our continuing mission to provide you with exceptional heart care, we have created designated Provider Care Teams.  These Care Teams include your primary Cardiologist (physician) and Advanced Practice Providers (APPs -  Physician Assistants and Nurse Practitioners) who all work together to provide you with the care you need, when you need it.  We recommend signing up for the patient portal called "MyChart".  Sign up information is provided on this After Visit Summary.  MyChart is used to connect with patients for Virtual Visits (Telemedicine).  Patients are able to view lab/test results, encounter notes, upcoming appointments, etc.  Non-urgent messages can be sent to your provider as well.   To learn more about what you can do with MyChart, go to [email or web address].    Your next appointment:   12 month(s)  The format for your next appointment:   In Person  Provider:   Olga Millers, MD

## 2020-09-02 ENCOUNTER — Other Ambulatory Visit: Payer: Self-pay | Admitting: Cardiology

## 2020-09-02 LAB — COMPREHENSIVE METABOLIC PANEL
ALT: 11 IU/L (ref 0–32)
AST: 15 IU/L (ref 0–40)
Albumin/Globulin Ratio: 1.2 (ref 1.2–2.2)
Albumin: 4 g/dL (ref 3.6–4.6)
Alkaline Phosphatase: 81 IU/L (ref 44–121)
BUN/Creatinine Ratio: 15 (ref 12–28)
BUN: 22 mg/dL (ref 8–27)
Bilirubin Total: 1.2 mg/dL (ref 0.0–1.2)
CO2: 25 mmol/L (ref 20–29)
Calcium: 9.1 mg/dL (ref 8.7–10.3)
Chloride: 100 mmol/L (ref 96–106)
Creatinine, Ser: 1.46 mg/dL — ABNORMAL HIGH (ref 0.57–1.00)
Globulin, Total: 3.3 g/dL (ref 1.5–4.5)
Glucose: 114 mg/dL — ABNORMAL HIGH (ref 65–99)
Potassium: 4 mmol/L (ref 3.5–5.2)
Sodium: 141 mmol/L (ref 134–144)
Total Protein: 7.3 g/dL (ref 6.0–8.5)
eGFR: 36 mL/min/{1.73_m2} — ABNORMAL LOW (ref >59)

## 2020-09-02 LAB — LIPID PANEL
Chol/HDL Ratio: 4.2 ratio (ref 0.0–4.4)
Cholesterol, Total: 142 mg/dL (ref 100–199)
HDL: 34 mg/dL — ABNORMAL LOW (ref >39)
LDL Chol Calc (NIH): 87 mg/dL (ref 0–99)
Triglycerides: 115 mg/dL (ref 0–149)
VLDL Cholesterol Cal: 21 mg/dL (ref 5–40)

## 2020-09-04 ENCOUNTER — Telehealth: Payer: Self-pay | Admitting: *Deleted

## 2020-09-04 ENCOUNTER — Other Ambulatory Visit: Payer: Self-pay | Admitting: Cardiology

## 2020-09-04 DIAGNOSIS — E7800 Pure hypercholesterolemia, unspecified: Secondary | ICD-10-CM

## 2020-09-04 MED ORDER — EZETIMIBE 10 MG PO TABS: 10.0000 mg | ORAL_TABLET | Freq: Every day | ORAL | 3 refills | Status: DC

## 2020-09-04 NOTE — Telephone Encounter (Addendum)
pt aware of results  New script sent to the pharmacy and Lab orders mailed to the pt    ----- Message from Lewayne Bunting, MD sent at 09/03/2020  6:58 AM EDT ----- Add zetia 10 mg daily; lipids and liver 12 weeks Olga Millers

## 2020-10-08 ENCOUNTER — Other Ambulatory Visit: Payer: Self-pay | Admitting: Cardiology

## 2020-11-24 ENCOUNTER — Other Ambulatory Visit: Payer: Self-pay

## 2020-11-24 DIAGNOSIS — E7800 Pure hypercholesterolemia, unspecified: Secondary | ICD-10-CM

## 2020-11-24 MED ORDER — EZETIMIBE 10 MG PO TABS: 10.0000 mg | ORAL_TABLET | Freq: Every day | ORAL | 3 refills | Status: DC

## 2020-11-30 ENCOUNTER — Other Ambulatory Visit: Payer: Self-pay | Admitting: Cardiology

## 2020-12-16 ENCOUNTER — Other Ambulatory Visit: Payer: Self-pay | Admitting: Cardiology

## 2020-12-25 LAB — HEPATIC FUNCTION PANEL
ALT: 8 IU/L (ref 0–32)
AST: 15 IU/L (ref 0–40)
Albumin: 4 g/dL (ref 3.6–4.6)
Alkaline Phosphatase: 73 IU/L (ref 44–121)
Bilirubin Total: 0.7 mg/dL (ref 0.0–1.2)
Bilirubin, Direct: 0.18 mg/dL (ref 0.00–0.40)
Total Protein: 7.1 g/dL (ref 6.0–8.5)

## 2020-12-25 LAB — LIPID PANEL
Chol/HDL Ratio: 3.4 ratio (ref 0.0–4.4)
Cholesterol, Total: 105 mg/dL (ref 100–199)
HDL: 31 mg/dL — ABNORMAL LOW (ref >39)
LDL Chol Calc (NIH): 50 mg/dL (ref 0–99)
Triglycerides: 134 mg/dL (ref 0–149)
VLDL Cholesterol Cal: 24 mg/dL (ref 5–40)

## 2021-04-12 ENCOUNTER — Other Ambulatory Visit: Payer: Self-pay | Admitting: Cardiology

## 2021-06-21 ENCOUNTER — Other Ambulatory Visit: Payer: Self-pay | Admitting: Cardiology

## 2021-06-21 DIAGNOSIS — I10 Essential (primary) hypertension: Secondary | ICD-10-CM

## 2021-07-26 ENCOUNTER — Other Ambulatory Visit: Payer: Self-pay | Admitting: Cardiology

## 2021-08-06 ENCOUNTER — Other Ambulatory Visit: Payer: Self-pay | Admitting: Cardiology

## 2021-08-11 ENCOUNTER — Ambulatory Visit (HOSPITAL_COMMUNITY)
Admission: RE | Admit: 2021-08-11 | Discharge: 2021-08-11 | Disposition: A | Discharge status: Home / Self Care | Payer: Medicare HMO | Source: Ambulatory Visit | Attending: Cardiology | Admitting: Cardiology

## 2021-08-11 ENCOUNTER — Other Ambulatory Visit: Payer: Self-pay

## 2021-08-11 DIAGNOSIS — I6523 Occlusion and stenosis of bilateral carotid arteries: Secondary | ICD-10-CM | POA: Diagnosis present

## 2021-08-12 ENCOUNTER — Encounter: Payer: Self-pay | Admitting: *Deleted

## 2021-08-25 ENCOUNTER — Other Ambulatory Visit: Payer: Self-pay

## 2021-09-17 ENCOUNTER — Other Ambulatory Visit (HOSPITAL_COMMUNITY): Payer: Self-pay | Admitting: Cardiology

## 2021-09-17 DIAGNOSIS — I6523 Occlusion and stenosis of bilateral carotid arteries: Secondary | ICD-10-CM

## 2021-09-27 ENCOUNTER — Other Ambulatory Visit: Payer: Self-pay | Admitting: Cardiology

## 2021-09-27 DIAGNOSIS — E7800 Pure hypercholesterolemia, unspecified: Secondary | ICD-10-CM

## 2021-11-17 ENCOUNTER — Other Ambulatory Visit: Payer: Self-pay | Admitting: Cardiology

## 2021-11-17 DIAGNOSIS — E7800 Pure hypercholesterolemia, unspecified: Secondary | ICD-10-CM

## 2021-11-22 ENCOUNTER — Other Ambulatory Visit: Payer: Self-pay | Admitting: Cardiology

## 2021-11-22 DIAGNOSIS — I10 Essential (primary) hypertension: Secondary | ICD-10-CM

## 2021-12-01 ENCOUNTER — Other Ambulatory Visit: Payer: Self-pay | Admitting: Cardiology

## 2022-01-15 ENCOUNTER — Other Ambulatory Visit: Payer: Self-pay | Admitting: Cardiology

## 2022-01-15 DIAGNOSIS — I10 Essential (primary) hypertension: Secondary | ICD-10-CM

## 2022-01-30 ENCOUNTER — Other Ambulatory Visit: Payer: Self-pay | Admitting: Cardiovascular Disease

## 2022-01-30 DIAGNOSIS — E7800 Pure hypercholesterolemia, unspecified: Secondary | ICD-10-CM

## 2022-04-01 ENCOUNTER — Other Ambulatory Visit: Payer: Self-pay | Admitting: Cardiology

## 2022-04-01 DIAGNOSIS — I10 Essential (primary) hypertension: Secondary | ICD-10-CM

## 2022-04-17 ENCOUNTER — Other Ambulatory Visit: Payer: Self-pay | Admitting: Cardiovascular Disease

## 2022-04-17 DIAGNOSIS — E7800 Pure hypercholesterolemia, unspecified: Secondary | ICD-10-CM

## 2022-04-25 ENCOUNTER — Other Ambulatory Visit: Payer: Self-pay | Admitting: Cardiology

## 2022-04-25 DIAGNOSIS — I10 Essential (primary) hypertension: Secondary | ICD-10-CM

## 2022-04-27 ENCOUNTER — Other Ambulatory Visit: Payer: Self-pay | Admitting: Cardiology

## 2022-05-18 ENCOUNTER — Other Ambulatory Visit: Payer: Self-pay | Admitting: Cardiology

## 2022-05-18 DIAGNOSIS — I10 Essential (primary) hypertension: Secondary | ICD-10-CM

## 2022-06-30 ENCOUNTER — Other Ambulatory Visit: Payer: Self-pay | Admitting: Cardiovascular Disease

## 2022-06-30 DIAGNOSIS — E7800 Pure hypercholesterolemia, unspecified: Secondary | ICD-10-CM

## 2022-07-09 ENCOUNTER — Other Ambulatory Visit: Payer: Self-pay | Admitting: Cardiology

## 2022-07-09 DIAGNOSIS — I10 Essential (primary) hypertension: Secondary | ICD-10-CM

## 2022-07-27 ENCOUNTER — Other Ambulatory Visit: Payer: Self-pay | Admitting: Cardiology

## 2022-08-10 ENCOUNTER — Other Ambulatory Visit: Payer: Self-pay | Admitting: Cardiology

## 2022-09-01 ENCOUNTER — Other Ambulatory Visit: Payer: Self-pay | Admitting: Cardiology

## 2022-09-01 DIAGNOSIS — I10 Essential (primary) hypertension: Secondary | ICD-10-CM

## 2022-09-14 ENCOUNTER — Ambulatory Visit (HOSPITAL_COMMUNITY)
Admission: RE | Admit: 2022-09-14 | Discharge: 2022-09-14 | Disposition: A | Discharge status: Home / Self Care | Payer: Medicare HMO | Source: Ambulatory Visit | Attending: Cardiovascular Disease | Admitting: Cardiovascular Disease

## 2022-09-14 DIAGNOSIS — I6523 Occlusion and stenosis of bilateral carotid arteries: Secondary | ICD-10-CM | POA: Diagnosis present

## 2022-09-16 ENCOUNTER — Encounter: Payer: Self-pay | Admitting: *Deleted

## 2022-09-17 ENCOUNTER — Other Ambulatory Visit: Payer: Self-pay | Admitting: Cardiology

## 2022-09-24 ENCOUNTER — Other Ambulatory Visit: Payer: Self-pay | Admitting: Cardiology

## 2022-09-24 DIAGNOSIS — I10 Essential (primary) hypertension: Secondary | ICD-10-CM

## 2022-10-19 ENCOUNTER — Other Ambulatory Visit: Payer: Self-pay | Admitting: Cardiology

## 2022-10-19 DIAGNOSIS — I10 Essential (primary) hypertension: Secondary | ICD-10-CM

## 2022-10-19 NOTE — Telephone Encounter (Signed)
Pt has not been seen in the office over a year And has not scheduled an appt to be seen for further refills

## 2022-11-27 NOTE — Progress Notes (Signed)
Cardiology Clinic Note   Patient Name: Sophia Schroeder Date of Encounter: 11/29/2022  Primary Care Provider:  Verlon Au, MD Primary Cardiologist:  Olga Millers, MD  Patient Profile    82 year old female with known history of CAD with CABG X 4 (LIMA to the LAD, saphenous vein graft to the second diagonal, sequential saphenous vein graft to the first diagonal and obtuse marginal and a sequential saphenous vein graft to the PDA and posterior lateral.), carotid artery disease,HL, HTN.    Past Medical History    Past Medical History:  Diagnosis Date   Benign hypertension    Carotid artery stenosis    without infarction   Coronary atherosclerosis of autologous vein bypass graft    Mixed hyperlipidemia    Past Surgical History:  Procedure Laterality Date   CATARACT EXTRACTION  june 2013   CORONARY ARTERY BYPASS GRAFT  10/10/2001   NOSE SURGERY     right total hip arthroplasty  03/11/2002   TUBAL LIGATION      Allergies  No Known Allergies  History of Present Illness    Sophia Schroeder comes today for ongoing assessment and management of CAD, HTN, HL, and carotid artery disease.  [place] apartment and remains active with new activities.  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  She has recently been started on sertraline 25 mg daily by primary care to assist with some depression and anxiety which had worsened after the death of her husband.  She has been having some episodes of fatigue and some lightheadedness, no syncope or presyncope.  She denies chest discomfort, dyspnea on exertion.  Home Medications    Current Outpatient Medications  Medication Sig Dispense Refill   aspirin 81 MG EC tablet Take 81 mg by mouth daily.     calcium carbonate (OS-CAL) 600 MG TABS Take 600 mg by mouth. occasionally     carvedilol (COREG) 12.5 MG tablet Take 1 tablet (12.5 mg total) by mouth  2 (two) times daily. 180 tablet 3   carvedilol (COREG) 6.25 MG tablet Take 1 tablet (6.25 mg total) by mouth 2 (two) times daily. 180 tablet 3   hydrochlorothiazide (MICROZIDE) 12.5 MG capsule Take 1 capsule (12.5 mg total) by mouth daily. 90 capsule 3   Multiple Vitamin (MULTIVITAMIN) tablet 1 tab occasionally     amLODipine-benazepril (LOTREL) 10-20 MG capsule TAKE 1 CAPSULE EVERY DAY (PLEASE SCHEDULE APPOINTMENT FOR FURTHER REFILLS) 90 capsule 3   atorvastatin (LIPITOR) 80 MG tablet NEED OV. 90 tablet 3   ezetimibe (ZETIA) 10 MG tablet TAKE 1 TABLET EVERY DAY (NEED TO SCHEDULE AN OFFICE VISIT FOR FUTURE REFILLS) 90 tablet 3   potassium chloride (KLOR-CON) 10 MEQ tablet Take 1 tablet (10 mEq total) by mouth daily. 90 tablet 3   No current facility-administered medications for this visit.     Family History    Family History  Problem Relation Age of Onset   Heart disease Father    Heart attack Father    Stroke Mother    Arthritis Mother    She indicated that her mother is deceased. She indicated that her father is deceased.  Social History    Social History   Socioeconomic History   Marital status: Married    Spouse name: Not on file   Number of children: Not on file   Years of education: Not on  file   Highest education level: Not on file  Occupational History   Not on file  Tobacco Use   Smoking status: Never   Smokeless tobacco: Never  Substance and Sexual Activity   Alcohol use: No   Drug use: No   Sexual activity: Not on file  Other Topics Concern   Not on file  Social History Narrative   Not on file   Social Determinants of Health   Financial Resource Strain: Not on file  Food Insecurity: Not on file  Transportation Needs: Not on file  Physical Activity: Not on file  Stress: Not on file  Social Connections: Not on file  Intimate Partner Violence: Not on file     Review of Systems    General:  No chills, fever, night sweats or weight changes.   Cardiovascular:  No chest pain, dyspnea on exertion, edema, orthopnea, palpitations, paroxysmal nocturnal dyspnea.  Positive for some lightheadedness Dermatological: No rash, lesions/masses Respiratory: No cough, dyspnea Urologic: No hematuria, dysuria Abdominal:   No nausea, vomiting, diarrhea, bright red blood per rectum, melena, or hematemesis Neurologic:  No visual changes, wkns, changes in mental status. All other systems reviewed and are otherwise negative except as noted above.  EKG Interpretation Date/Time:  Tuesday November 29 2022 11:03:06 EDT Ventricular Rate:  46 PR Interval:  264 QRS Duration:  88 QT Interval:  436 QTC Calculation: 381 R Axis:   -8  Text Interpretation: Sinus bradycardia with 1st degree A-V block Septal infarct (cited on or before 05-Oct-2001) T wave abnormality, consider lateral ischemia When compared with ECG of 05-Jun-2002 13:47, PR interval has increased Vent. rate has decreased BY  24 BPM Questionable change in QRS axis T wave inversion less evident in Lateral leads Confirmed by Joni Reining [phone]) on 11/29/2022 11:25:26 AM    Physical Exam    VS:  BP 118/76   Pulse (!) 46   Ht 5\' 5"  (1.651 m)   Wt 155 lb 9.6 oz (70.6 kg)   SpO2 96%   BMI 25.89 kg/m  , BMI Body mass index is 25.89 kg/m.     GEN: Well nourished, well developed, in no acute distress. HEENT: normal. Neck: Supple, no JVD, carotid bruits, or masses. Cardiac: RRR, murmurs, rubs, or gallops. No clubbing, cyanosis, edema.  Radials/DP/PT 2+ and equal bilaterally.  Respiratory:  Respirations regular and unlabored, clear to auscultation bilaterally. GI: Soft, nontender, nondistended, BS + x 4. MS: no deformity or atrophy. Skin: warm and dry, no rash. Neuro:  Strength and sensation are intact. Psych: Normal affect.  EKG Interpretation Date/Time:  Tuesday November 29 2022 11:03:06 EDT Ventricular Rate:  46 PR Interval:  264 QRS Duration:  88 QT Interval:  436 QTC Calculation: 381 R  Axis:   -8  Text Interpretation: Sinus bradycardia with 1st degree A-V block Septal infarct (cited on or before 05-Oct-2001) T wave abnormality, consider lateral ischemia When compared with ECG of 05-Jun-2002 13:47, PR interval has increased Vent. rate has decreased BY  24 BPM Questionable change in QRS axis T wave inversion less evident in Lateral leads Confirmed by Joni Reining [phone]) on 11/29/2022 11:25:26 AM   No results found for: "WBC", "HGB", "HCT", "MCV", "PLT" Lab Results  Component Value Date   CREATININE 1.46 (H) 09/02/2020   BUN 22 09/02/2020   NA 141 09/02/2020   K 4.0 09/02/2020   CL 100 09/02/2020   CO2 25 09/02/2020   Lab Results  Component Value Date   ALT 8 12/24/2020  AST 15 12/24/2020   ALKPHOS 73 12/24/2020   BILITOT 0.7 12/24/2020   Lab Results  Component Value Date   CHOL 105 12/24/2020   HDL 31 (L) 12/24/2020   LDLCALC 50 12/24/2020   TRIG 134 12/24/2020   CHOLHDL 3.4 12/24/2020    Lab Results  Component Value Date   HGBA1C 6.3 04/21/2009     Review of Prior Studies EKG Interpretation Date/Time:  Tuesday November 29 2022 11:03:06 EDT Ventricular Rate:  46 PR Interval:  264 QRS Duration:  88 QT Interval:  436 QTC Calculation: 381 R Axis:   -8  Text Interpretation: Sinus bradycardia with 1st degree A-V block Septal infarct (cited on or before 05-Oct-2001) T wave abnormality, consider lateral ischemia When compared with ECG of 05-Jun-2002 13:47, PR interval has increased Vent. rate has decreased BY  24 BPM Questionable change in QRS axis T wave inversion less evident in Lateral leads Confirmed by Joni Reining [phone]) on 11/29/2022 11:25:26 AM  Right Carotid: Velocities in the right ICA are consistent with a 1-39%  stenosis.   Left Carotid: Velocities in the left ICA are consistent with a 40-59%  stenosis.               The ECA appears >50% stenosed.   Vertebrals:  Bilateral vertebral arteries demonstrate antegrade flow.  Subclavians: Right  subclavian artery flow was disturbed. Normal flow               hemodynamics were seen in the left subclavian artery.   NM Stress Test 1019/2016  Nuclear stress EF: 49%. The left ventricular ejection fraction is mildly decreased (45-54%). T wave inversion was noted during recovery in the II, III, aVF, V5 and V6 leads. Blood pressure demonstrated a hypertensive response to exercise. Defect 1: There is a large defect of severe severity present in the mid anterior, mid anteroseptal, apical anterior, apical septal and apex location. Mostly infarct in the LAD territory with a small amount of peri-infarct ischemia in the mid anterior region. Defect 2: There is a medium defect of severe severity present in the mid inferolateral, mid anterolateral and apical lateral location. Infarct in the mid to distal left circumflex artery territory. Findings consistent with prior myocardial infarction with peri-infarct ischemia.   Assessment & Plan   1.  Coronary artery disease: History of CABG x 4 in 2003.  She denies any recurrent symptoms with the exception of some mild lightheadedness.  I reviewed her EKG and she was found to be bradycardic heart rate 46 bpm with a first-degree AV block.  I will decrease her carvedilol from 25 mg twice daily to 18.75 mg twice daily to help her with heart rate and to provide her with a little higher blood pressure reading for this elderly woman..  Today it was 118/76.  Hopefully this will help her to feel some better without causing her blood pressure to rise to  suddenly.  She will report back if she has any changes in her symptoms for the worse.  2.  Hypercholesterolemia: Has not had lipids drawn by PCP.  Will have these done this week.  Maintaining LDL less than 70.  Continue atorvastatin  3.  Hypertension: She is on amlodipine benazepril 10/20 mg daily along with HCTZ 12.5 mg daily in addition to the beta-blocker.  Would recommend on next office visit if he still is having some  symptoms of lightheadedness to discontinue HCTZ.  She will see [place] again in 6 months for follow-up.  4.  Depression/anxiety: Much better controlled with addition of sertraline 25 mg daily by PCP.   Marland Kitchen     Signed, Bettey Mare. Liborio Nixon, ANP, AACC   11/29/2022 12:00 PM      Office [phone] Fax [phone]  Notice: This dictation was prepared with Dragon dictation along with smaller phrase technology. Any transcriptional errors that result from this process are unintentional and may not be corrected upon review.

## 2022-11-29 ENCOUNTER — Ambulatory Visit: Payer: Medicare HMO | Attending: Adult Health | Admitting: Adult Health

## 2022-11-29 ENCOUNTER — Encounter: Payer: Self-pay | Admitting: Adult Health

## 2022-11-29 VITALS — BP 118/76 | HR 46 | Ht 65.0 in | Wt 155.6 lb

## 2022-11-29 DIAGNOSIS — I6523 Occlusion and stenosis of bilateral carotid arteries: Secondary | ICD-10-CM | POA: Diagnosis not present

## 2022-11-29 DIAGNOSIS — E7800 Pure hypercholesterolemia, unspecified: Secondary | ICD-10-CM | POA: Diagnosis not present

## 2022-11-29 DIAGNOSIS — I10 Essential (primary) hypertension: Secondary | ICD-10-CM | POA: Diagnosis not present

## 2022-11-29 DIAGNOSIS — I2510 Atherosclerotic heart disease of native coronary artery without angina pectoris: Secondary | ICD-10-CM

## 2022-11-29 MED ORDER — CARVEDILOL 12.5 MG PO TABS: 12.5000 mg | ORAL_TABLET | Freq: Two times a day (BID) | ORAL | 3 refills | Status: DC

## 2022-11-29 MED ORDER — HYDROCHLOROTHIAZIDE 12.5 MG PO CAPS: 12.5000 mg | ORAL_CAPSULE | Freq: Every day | ORAL | 3 refills | Status: DC

## 2022-11-29 MED ORDER — EZETIMIBE 10 MG PO TABS
ORAL_TABLET | ORAL | 3 refills | Status: DC
Start: 2022-11-29 — End: 2024-02-20

## 2022-11-29 MED ORDER — AMLODIPINE BESY-BENAZEPRIL HCL 10-20 MG PO CAPS: ORAL_CAPSULE | ORAL | 3 refills | Status: DC

## 2022-11-29 MED ORDER — POTASSIUM CHLORIDE ER 10 MEQ PO TBCR: 10.0000 meq | EXTENDED_RELEASE_TABLET | Freq: Every day | ORAL | 3 refills | Status: DC

## 2022-11-29 MED ORDER — ATORVASTATIN CALCIUM 80 MG PO TABS: ORAL_TABLET | ORAL | 3 refills | Status: DC

## 2022-11-29 MED ORDER — CARVEDILOL 6.25 MG PO TABS: 6.2500 mg | ORAL_TABLET | Freq: Two times a day (BID) | ORAL | 3 refills | Status: DC

## 2022-11-29 NOTE — Patient Instructions (Signed)
Medication Instructions:  Decrease Carvedilol from 25 mg to 18.75 mg Twice Daily). *If you need a refill on your cardiac medications before your next appointment, please call your pharmacy*   Lab Work: Lipid Panel, Hepatic Panel If you have labs (blood work) drawn today and your tests are completely normal, you will receive your results only by: MyChart Message (if you have MyChart) OR A paper copy in the mail If you have any lab test that is abnormal or we need to change your treatment, we will call you to review the results.   Testing/Procedures: No Testing   Follow-Up: At [facility], you and your health needs are our priority.  As part of our continuing mission to provide you with exceptional heart care, we have created designated Provider Care Teams.  These Care Teams include your primary Cardiologist (physician) and Advanced Practice Providers (APPs -  Physician Assistants and Nurse Practitioners) who all work together to provide you with the care you need, when you need it.  We recommend signing up for the patient portal called "MyChart".  Sign up information is provided on this After Visit Summary.  MyChart is used to connect with patients for Virtual Visits (Telemedicine).  Patients are able to view lab/test results, encounter notes, upcoming appointments, etc.  Non-urgent messages can be sent to your provider as well.   To learn more about what you can do with MyChart, go to [email or web address].    Your next appointment:   6 month(s)  Provider:   Olga Millers, MD

## 2022-11-30 LAB — LIPID PANEL
Chol/HDL Ratio: 3.4 ratio (ref 0.0–4.4)
Cholesterol, Total: 124 mg/dL (ref 100–199)
HDL: 37 mg/dL — ABNORMAL LOW (ref >39)
LDL Chol Calc (NIH): 70 mg/dL (ref 0–99)
Triglycerides: 88 mg/dL (ref 0–149)
VLDL Cholesterol Cal: 17 mg/dL (ref 5–40)

## 2022-11-30 LAB — HEPATIC FUNCTION PANEL
ALT: 8 IU/L (ref 0–32)
AST: 16 IU/L (ref 0–40)
Albumin: 4.2 g/dL (ref 3.7–4.7)
Alkaline Phosphatase: 81 IU/L (ref 44–121)
Bilirubin Total: 1.1 mg/dL (ref 0.0–1.2)
Bilirubin, Direct: 0.24 mg/dL (ref 0.00–0.40)
Total Protein: 7.4 g/dL (ref 6.0–8.5)

## 2022-12-02 ENCOUNTER — Telehealth: Payer: Self-pay

## 2022-12-02 NOTE — Telephone Encounter (Addendum)
Called patient regarding results . Patient had understanding of results.----- Message from Jodelle Gross, NP sent at 11/30/2022  5:05 PM EDT ----- I have reviewed the labs are within normal limits.  Continue her current medication regimen.  No concerns here.  KL

## 2022-12-12 ENCOUNTER — Inpatient Hospital Stay (HOSPITAL_COMMUNITY)
Admission: EM | Admit: 2022-12-12 | Discharge: 2022-12-15 | DRG: 312 | Disposition: A | Discharge status: Home Health | Payer: Medicare HMO | Attending: Internal Medicine | Admitting: Internal Medicine

## 2022-12-12 ENCOUNTER — Emergency Department (HOSPITAL_COMMUNITY): Payer: Medicare HMO

## 2022-12-12 ENCOUNTER — Encounter (HOSPITAL_COMMUNITY): Payer: Self-pay

## 2022-12-12 ENCOUNTER — Other Ambulatory Visit: Payer: Self-pay

## 2022-12-12 DIAGNOSIS — W19XXXA Unspecified fall, initial encounter: Secondary | ICD-10-CM | POA: Diagnosis present

## 2022-12-12 DIAGNOSIS — N1832 Chronic kidney disease, stage 3b: Secondary | ICD-10-CM | POA: Diagnosis present

## 2022-12-12 DIAGNOSIS — Z8249 Family history of ischemic heart disease and other diseases of the circulatory system: Secondary | ICD-10-CM

## 2022-12-12 DIAGNOSIS — Z9851 Tubal ligation status: Secondary | ICD-10-CM

## 2022-12-12 DIAGNOSIS — R55 Syncope and collapse: Secondary | ICD-10-CM | POA: Diagnosis present

## 2022-12-12 DIAGNOSIS — Z79899 Other long term (current) drug therapy: Secondary | ICD-10-CM

## 2022-12-12 DIAGNOSIS — E860 Dehydration: Secondary | ICD-10-CM | POA: Diagnosis present

## 2022-12-12 DIAGNOSIS — M47812 Spondylosis without myelopathy or radiculopathy, cervical region: Secondary | ICD-10-CM | POA: Diagnosis present

## 2022-12-12 DIAGNOSIS — Z7982 Long term (current) use of aspirin: Secondary | ICD-10-CM | POA: Diagnosis not present

## 2022-12-12 DIAGNOSIS — N179 Acute kidney failure, unspecified: Secondary | ICD-10-CM | POA: Diagnosis present

## 2022-12-12 DIAGNOSIS — I25810 Atherosclerosis of coronary artery bypass graft(s) without angina pectoris: Secondary | ICD-10-CM | POA: Diagnosis not present

## 2022-12-12 DIAGNOSIS — I951 Orthostatic hypotension: Principal | ICD-10-CM | POA: Diagnosis present

## 2022-12-12 DIAGNOSIS — I129 Hypertensive chronic kidney disease with stage 1 through stage 4 chronic kidney disease, or unspecified chronic kidney disease: Secondary | ICD-10-CM | POA: Diagnosis present

## 2022-12-12 DIAGNOSIS — E782 Mixed hyperlipidemia: Secondary | ICD-10-CM | POA: Diagnosis present

## 2022-12-12 DIAGNOSIS — I25710 Atherosclerosis of autologous vein coronary artery bypass graft(s) with unstable angina pectoris: Secondary | ICD-10-CM | POA: Diagnosis not present

## 2022-12-12 DIAGNOSIS — I2510 Atherosclerotic heart disease of native coronary artery without angina pectoris: Secondary | ICD-10-CM | POA: Diagnosis present

## 2022-12-12 DIAGNOSIS — E876 Hypokalemia: Secondary | ICD-10-CM | POA: Diagnosis not present

## 2022-12-12 DIAGNOSIS — Z96641 Presence of right artificial hip joint: Secondary | ICD-10-CM | POA: Diagnosis present

## 2022-12-12 DIAGNOSIS — I10 Essential (primary) hypertension: Secondary | ICD-10-CM | POA: Diagnosis not present

## 2022-12-12 LAB — BASIC METABOLIC PANEL
Anion gap: 16 — ABNORMAL HIGH (ref 5–15)
BUN: 38 mg/dL — ABNORMAL HIGH (ref 8–23)
CO2: 20 mmol/L — ABNORMAL LOW (ref 22–32)
Calcium: 8.7 mg/dL — ABNORMAL LOW (ref 8.9–10.3)
Chloride: 98 mmol/L (ref 98–111)
Creatinine, Ser: 2.33 mg/dL — ABNORMAL HIGH (ref 0.44–1.00)
GFR, Estimated: 20 mL/min — ABNORMAL LOW (ref >60)
Glucose, Bld: 159 mg/dL — ABNORMAL HIGH (ref 70–99)
Potassium: 3.5 mmol/L (ref 3.5–5.1)
Sodium: 134 mmol/L — ABNORMAL LOW (ref 135–145)

## 2022-12-12 LAB — CBC
HCT: 34.5 % — ABNORMAL LOW (ref 36.0–46.0)
Hemoglobin: 11.5 g/dL — ABNORMAL LOW (ref 12.0–15.0)
MCH: 30.2 pg (ref 26.0–34.0)
MCHC: 33.3 g/dL (ref 30.0–36.0)
MCV: 90.6 fL (ref 80.0–100.0)
Platelets: 204 10*3/uL (ref 150–400)
RBC: 3.81 MIL/uL — ABNORMAL LOW (ref 3.87–5.11)
RDW: 12.8 % (ref 11.5–15.5)
WBC: 10.1 10*3/uL (ref 4.0–10.5)
nRBC: 0 % (ref 0.0–0.2)

## 2022-12-12 LAB — HEPATIC FUNCTION PANEL
ALT: 19 U/L (ref 0–44)
AST: 24 U/L (ref 15–41)
Albumin: 3.3 g/dL — ABNORMAL LOW (ref 3.5–5.0)
Alkaline Phosphatase: 68 U/L (ref 38–126)
Bilirubin, Direct: 0.2 mg/dL (ref 0.0–0.2)
Indirect Bilirubin: 1.5 mg/dL — ABNORMAL HIGH (ref 0.3–0.9)
Total Bilirubin: 1.7 mg/dL — ABNORMAL HIGH (ref 0.3–1.2)
Total Protein: 8.1 g/dL (ref 6.5–8.1)

## 2022-12-12 LAB — TROPONIN I (HIGH SENSITIVITY)
Troponin I (High Sensitivity): 16 ng/L (ref <18)
Troponin I (High Sensitivity): 15 ng/L (ref <18)

## 2022-12-12 LAB — AMMONIA: Ammonia: <10 umol/L (ref 9–35)

## 2022-12-12 MED ORDER — LACTATED RINGERS IV BOLUS
1000.0000 mL | Freq: Once | INTRAVENOUS | Status: AC
  Administered 2022-12-12: 1000 mL via INTRAVENOUS

## 2022-12-12 MED ORDER — LACTATED RINGERS IV SOLN: INTRAVENOUS | Status: DC

## 2022-12-12 NOTE — H&P (Incomplete)
History and Physical    Patient: Sophia Schroeder DOB: 1940-12-30 DOA: 12/12/2022 DOS: the patient was seen and examined on 12/12/2022 PCP: Verlon Au, MD  Patient coming from: Home  Chief Complaint:  Chief Complaint  Patient presents with  . Altered Mental Status   HPI: Sophia Schroeder is a 82 y.o. female with medical history significant of CAD with CABG X 4 , HTN, HLD presents with syncopal episode. She went to see her PCP today and was a given a steroid shot for neck pain . After she went home she went onto have a syncopal episode which was witnessed by her son. Of note pt also has presyncopal episode a couple of days ago. Denies seizures, post ictal state, nausea, vomiting, headache, vision changes, speech changes, unilateral limb weakness, paraesthesia/numbness, ataxia or urinary/bowel incontinence.  Denies chest pain, palpitations, cough, dyspnea or fevers   Imaging CXR: Mild cardiomegaly CT head: No evidence of acute infarction  CT c-spine: Degenerative changes throughout the cervical spine likely account for anterior subluxations at C4-5 and C7-T1. No acute displaced fractures are identified. Patchy infiltrates in the right lung may indicate pneumonia. No acute intracranial abnormalities.  Mild cerebral atrophy.  ED course Vital signs on arrival... Labs: Na 134, Gluc 159, Cr 2.33, GFR 20, trop 16, bili 1.7, Hg 11.5  Review of Systems: As mentioned in the history of present illness. All other systems reviewed and are negative. Past Medical History:  Diagnosis Date  . Benign hypertension   . Carotid artery stenosis    without infarction  . Coronary atherosclerosis of autologous vein bypass graft   . Mixed hyperlipidemia    Past Surgical History:  Procedure Laterality Date  . CATARACT EXTRACTION  june 2013  . CORONARY ARTERY BYPASS GRAFT  10/10/2001  . NOSE SURGERY    . right total hip arthroplasty  03/11/2002  . TUBAL LIGATION     Social History:   reports that she has never smoked. She has never used smokeless tobacco. She reports that she does not drink alcohol and does not use drugs.  No Known Allergies  Family History  Problem Relation Age of Onset  . Heart disease Father   . Heart attack Father   . Stroke Mother   . Arthritis Mother     Prior to Admission medications   Medication Sig Start Date End Date Taking? Authorizing Provider  amLODipine-benazepril (LOTREL) 10-20 MG capsule TAKE 1 CAPSULE EVERY DAY (PLEASE SCHEDULE APPOINTMENT FOR FURTHER REFILLS) 11/29/22   Jodelle Gross, NP  aspirin 81 MG EC tablet Take 81 mg by mouth daily.    [provider]  atorvastatin (LIPITOR) 80 MG tablet NEED OV. 11/29/22   Jodelle Gross, NP  calcium carbonate (OS-CAL) 600 MG TABS Take 600 mg by mouth. occasionally    [provider]  carvedilol (COREG) 12.5 MG tablet Take 1 tablet (12.5 mg total) by mouth 2 (two) times daily. 11/29/22 02/27/23  Jodelle Gross, NP  carvedilol (COREG) 6.25 MG tablet Take 1 tablet (6.25 mg total) by mouth 2 (two) times daily. 11/29/22   Jodelle Gross, NP  ezetimibe (ZETIA) 10 MG tablet TAKE 1 TABLET EVERY DAY (NEED TO SCHEDULE AN OFFICE VISIT FOR FUTURE REFILLS) 11/29/22   Jodelle Gross, NP  hydrochlorothiazide (MICROZIDE) 12.5 MG capsule Take 1 capsule (12.5 mg total) by mouth daily. 11/29/22 02/27/23  Jodelle Gross, NP  Multiple Vitamin (MULTIVITAMIN) tablet 1 tab occasionally    [provider]  potassium chloride (KLOR-CON) 10 MEQ tablet Take 1 tablet (10 mEq total) by mouth daily. 11/29/22   Jodelle Gross, NP    Physical Exam: Vitals:   12/12/22 1835 12/12/22 2045 12/12/22 2057 12/12/22 2308  BP: 114/62 (!) 128/57    Pulse: 61 (!) 55    Resp: 19 19    Temp: (!) 97.5 F (36.4 C)   98.2 F (36.8 C)  TempSrc:    Oral  SpO2: 93% 96%    Weight:   70.6 kg   Height:   5\' 5"  (1.651 m)    General: Alert, no acute distress Cardio: Normal S1 and S2, RRR,  no r/m/g Pulm: CTAB, normal work of breathing Abdomen: Bowel sounds normal. Abdomen soft and non-tender.  Extremities: No peripheral edema.  Neuro: Cranial nerves grossly intact  ***  Data Reviewed: {Tip this will not be part of the note when signed- Document your independent interpretation of telemetry tracing, EKG, lab, Radiology test or any other diagnostic tests. Add any new diagnostic test ordered today. (Optional):[id number]} There are no new results to review at this time.  Assessment and Plan: No notes have been filed under this hospital service. Service: Hospitalist  Syncopal episode   CAD -Continue ASA   AKI  Cr 2.33, GFR 20, baseline 1.3-1.4. S/p 1LR in the ER.  -Avoid nephrotoxic agents -mIVF   HLD -Continue statin, zetia  HTN -Hold carvedilol, Lotrel and HCZT given soft BPs    Advance Care Planning:   Code Status: Not on file ***  Consults: ***  Family Communication: ***  Severity of Illness: The appropriate patient status for this patient is INPATIENT. Inpatient status is judged to be reasonable and necessary in order to provide the required intensity of service to ensure the patient's safety. The patient's presenting symptoms, physical exam findings, and initial radiographic and laboratory data in the context of their chronic comorbidities is felt to place them at high risk for further clinical deterioration. Furthermore, it is not anticipated that the patient will be medically stable for discharge from the hospital within 2 midnights of admission.   * I certify that at the point of admission it is my clinical judgment that the patient will require inpatient hospital care spanning beyond 2 midnights from the point of admission due to high intensity of service, high risk for further deterioration and high frequency of surveillance required.*  Author: Rolm Gala, MD 12/12/2022 11:09 PM  For on call review [email or web address].

## 2022-12-12 NOTE — ED Provider Triage Note (Signed)
Emergency Medicine Provider Triage Evaluation Note  Sophia Schroeder , a 82 y.o. female  was evaluated in triage.  Pt complains of altered mental status.  She has had failure to thrive/decreased appetite for the past few days.  Had a fall on Saturday.  Question syncope.  Patient reports that she remembers feeling off the next and she knows that she was on the floor.  She was seen by her primary care doctor today who did lab work.  Her son reports that when they brought her home from the primary care office she was rigid "stretching "and was unresponsive for around 30 seconds to 90 seconds.  He does not know if she was confused afterwards as he was on the phone with EMS.  She did not urinate on herself.  She reports that she just feels fatigued.  Has had decreased appetite only had half piece of toast today.  Denies any other symptoms.  No chest pain or shortness of breath.  Review of Systems  Positive:  Negative:   Physical Exam  BP 114/62 (BP Location: Right Arm)   Pulse 61   Temp (!) 97.5 F (36.4 C)   Resp 19   SpO2 93%  Gen:   Awake, no distress   Resp:  Normal effort  MSK:   Moves extremities without difficulty  Other:    Medical Decision Making  Medically screening exam initiated at 7:14 PM.  Appropriate orders placed.  Acey Lav was informed that the remainder of the evaluation will be completed by another provider, this initial triage assessment does not replace that evaluation, and the importance of remaining in the ED until their evaluation is complete.  CT head/neck ordered with labs. Concern for possible seizures. Charge nurse alerted that the patient needs a room.    Achille Rich, [place] 12/12/22 1925

## 2022-12-12 NOTE — ED Provider Notes (Signed)
[place] EMERGENCY DEPARTMENT AT [facility] Provider Note  MDM   HPI/ROS:  Sophia Schroeder is a 82 y.o. female with a medical history as below who presents for lightheadedness, presyncope, and confusion.  All of her symptoms reportedly charted after she was visiting her sister at the beach.  When she came back she was started having some confusion and believes that it was how she was sleeping on the pillow.  She reports that she has had some lightheadedness.  And has not had much of an appetite.  She had a presyncopal episode couple days ago and today she had a syncopal episode after she got home from her doctor's visit.  Son was there and witnessed it.  Denies any seizure-like activity or rhythmic jerking.  Patient roused shortly thereafter was not postictal.  Denies any nausea, vomiting, focal weakness, or dizziness.   Physical exam is notable for: - Normal neurologic exam without any focal weakness or deficit - Well-appearing, mild bradycardia  On my initial evaluation, patient is:  -Vital signs stable. Patient afebrile, hemodynamically stable, and non-toxic appearing. -Additional history obtained from son and daughter at bedside  This patient's current presentation, including their history and physical exam, is most consistent with dehydration in setting of a viral illness. Differentials include urinary tract infection, pneumonia, less likely early dementia.  Very low suspicion for CVA.  Overall patient is very well-appearing.  Do suspect that there may be some underlying infection causing her decreased appetite and general malaise.  This is likely contributing to the lightheadedness that she reports.  Very low suspicion that she has any central neurologic process, causing dizziness and she does not have any focal neurologic deficit on my exam.  She is alert and oriented to person place time and situation, however she does exhibit some confusion.  Unclear if this is her  baseline.  Interpretations, interventions, and the patient's course of care are documented below.    Clinical Course as of 12/12/22 2351  Mon Dec 12, 2022  2246 Ammonia: <10 Normal [BB]  2246 Troponin I (High Sensitivity): 15 Normal troponins have remained flat [BB]  2246 CT HEAD WO CONTRAST No evidence of ICH [BB]    Clinical Course User Index [BB] Fayrene Helper, MD    Labs show a significant AKI, and in the setting of decreased p.o. intake over the last week, suspect that this is likely prerenal azotemia.  Given her fluid bolus, and will discuss her case with the hospitalist.   Disposition: Admit  I discussed the case with hospitalist who graciously agreed to admit the patient to their service for continued care.   Clinical Impression:  1. Dehydration     Rx / DC Orders ED Discharge Orders     None       The plan for this patient was discussed with Dr. Eloise Harman, who voiced agreement and who oversaw evaluation and treatment of this patient.   Clinical Complexity A medically appropriate history, review of systems, and physical exam was performed.  My independent interpretations of EKG, labs, and radiology are documented in the ED course above.   If decision rules were used in this patient's evaluation, they are listed below.   Click here for ABCD2, HEART and other calculatorsREFRESH Note before signing   Patient's presentation is most consistent with acute presentation with potential threat to life or bodily function.  Medical Decision Making Amount and/or Complexity of Data Reviewed Labs: ordered. Decision-making details documented in ED Course. Radiology: ordered.  Decision-making details documented in ED Course.  Risk Decision regarding hospitalization.    HPI/ROS      See MDM section for pertinent HPI and ROS. A complete ROS was performed with pertinent positives/negatives noted above.   Past Medical History:  Diagnosis Date   Benign hypertension     Carotid artery stenosis    without infarction   Coronary atherosclerosis of autologous vein bypass graft    Mixed hyperlipidemia     Past Surgical History:  Procedure Laterality Date   CATARACT EXTRACTION  june 2013   CORONARY ARTERY BYPASS GRAFT  10/10/2001   NOSE SURGERY     right total hip arthroplasty  03/11/2002   TUBAL LIGATION        Physical Exam   Vitals:   12/12/22 1835 12/12/22 2045 12/12/22 2057  BP: 114/62 (!) 128/57   Pulse: 61 (!) 55   Resp: 19 19   Temp: (!) 97.5 F (36.4 C)    SpO2: 93% 96%   Weight:   70.6 kg  Height:   5\' 5"  (1.651 m)    Physical Exam Vitals and nursing note reviewed.  Constitutional:      General: She is not in acute distress.    Appearance: She is well-developed.  HENT:     Head: Normocephalic and atraumatic.  Eyes:     Extraocular Movements: Extraocular movements intact.     Conjunctiva/sclera: Conjunctivae normal.     Pupils: Pupils are equal, round, and reactive to light.  Cardiovascular:     Rate and Rhythm: Normal rate and regular rhythm.     Heart sounds: No murmur heard. Pulmonary:     Effort: Pulmonary effort is normal. No respiratory distress.     Breath sounds: Normal breath sounds.  Abdominal:     Palpations: Abdomen is soft.     Tenderness: There is no abdominal tenderness.  Musculoskeletal:        General: No swelling.     Cervical back: Normal range of motion and neck supple. No rigidity or tenderness.  Skin:    General: Skin is warm and dry.     Capillary Refill: Capillary refill takes less than 2 seconds.  Neurological:     General: No focal deficit present.     Mental Status: She is alert and oriented to person, place, and time. She is confused.     GCS: GCS eye subscore is 4. GCS verbal subscore is 5. GCS motor subscore is 6.     Cranial Nerves: Cranial nerves 2-12 are intact. No cranial nerve deficit, dysarthria or facial asymmetry.     Sensory: Sensation is intact. No sensory deficit.     Motor:  Motor function is intact. No weakness, tremor or abnormal muscle tone.     Coordination: Coordination is intact.  Psychiatric:        Mood and Affect: Mood normal.      Procedures   If procedures were preformed on this patient, they are listed below:  Procedures   Fayrene Helper, MD Emergency Medicine PGY-2   Please note that this documentation was produced with the assistance of voice-to-text technology and may contain errors.    Fayrene Helper, MD 12/13/22 1204    Rondel Baton, MD 12/14/22 [phone]

## 2022-12-12 NOTE — ED Triage Notes (Signed)
Pt's DIL reports that the patient has not had a good appetite for three days and has been weaker than normal. They had an appointment with PCP today and was given a steroid shot for neck pain and had blood work drawn. They took the patient home and while in the recliner she had two episodes of having her eyes open and looking at family but not responding or communicating with them. Patient is now A/O x 4 but states she feels a little "off."

## 2022-12-12 NOTE — H&P (Signed)
History and Physical    Patient: Sophia Schroeder RFF:[id number] DOB: Nov 15, 1940 DOA: 12/12/2022 DOS: the patient was seen and examined on 12/12/2022 PCP: Verlon Au, MD  Patient coming from: Home  Chief Complaint:  Chief Complaint  Patient presents with   Altered Mental Status   HPI: Sophia Schroeder is a 82 y.o. female with medical history significant of CAD with CABG X 4 , HTN, HLD presents with syncopal episode. She went to see her PCP today and was a given a steroid shot for neck pain . After she went home she went onto have a syncopal episode which was witnessed by her son. He found her slumped in a chair with her eyes open, mouth moving but no words coming out so he called 911. This episode lasted 30 seconds.No LOC. Of note pt also has been feeling dizziness for the last few days, has been off her food and had a fall at home. Denies seizures, post ictal state, nausea, vomiting, headache, vision changes, speech changes, unilateral limb weakness, paraesthesia/numbness, ataxia or urinary/bowel incontinence.  Denies chest pain, palpitations, cough, dyspnea or fevers.   Imaging CXR: Mild cardiomegaly CT head: No evidence of acute infarction  CT c-spine: Degenerative changes throughout the cervical spine likely account for anterior subluxations at C4-5 and C7-T1. No acute displaced fractures are identified. Patchy infiltrates in the right lung may indicate pneumonia. No acute intracranial abnormalities.  Mild cerebral atrophy.  ED course Vital signs on arrival... Labs: Na 134, Gluc 159, Cr 2.33, GFR 20, trop 16, bili 1.7, Hg 11.5  Review of Systems: As mentioned in the history of present illness. All other systems reviewed and are negative. Past Medical History:  Diagnosis Date   Benign hypertension    Carotid artery stenosis    without infarction   Coronary atherosclerosis of autologous vein bypass graft    Mixed hyperlipidemia    Past Surgical History:  Procedure Laterality  Date   CATARACT EXTRACTION  june 2013   CORONARY ARTERY BYPASS GRAFT  10/10/2001   NOSE SURGERY     right total hip arthroplasty  03/11/2002   TUBAL LIGATION     Social History:  reports that she has never smoked. She has never used smokeless tobacco. She reports that she does not drink alcohol and does not use drugs.  No Known Allergies  Family History  Problem Relation Age of Onset   Heart disease Father    Heart attack Father    Stroke Mother    Arthritis Mother     Prior to Admission medications   Medication Sig Start Date End Date Taking? Authorizing Provider  amLODipine-benazepril (LOTREL) 10-20 MG capsule TAKE 1 CAPSULE EVERY DAY (PLEASE SCHEDULE APPOINTMENT FOR FURTHER REFILLS) 11/29/22   Jodelle Gross, NP  aspirin 81 MG EC tablet Take 81 mg by mouth daily.    [provider]  atorvastatin (LIPITOR) 80 MG tablet NEED OV. 11/29/22   Jodelle Gross, NP  calcium carbonate (OS-CAL) 600 MG TABS Take 600 mg by mouth. occasionally    [provider]  carvedilol (COREG) 12.5 MG tablet Take 1 tablet (12.5 mg total) by mouth 2 (two) times daily. 11/29/22 02/27/23  Jodelle Gross, NP  carvedilol (COREG) 6.25 MG tablet Take 1 tablet (6.25 mg total) by mouth 2 (two) times daily. 11/29/22   Jodelle Gross, NP  ezetimibe (ZETIA) 10 MG tablet TAKE 1 TABLET EVERY DAY (NEED TO SCHEDULE AN OFFICE VISIT FOR FUTURE REFILLS) 11/29/22  Jodelle Gross, NP  hydrochlorothiazide (MICROZIDE) 12.5 MG capsule Take 1 capsule (12.5 mg total) by mouth daily. 11/29/22 02/27/23  Jodelle Gross, NP  Multiple Vitamin (MULTIVITAMIN) tablet 1 tab occasionally    [provider]  potassium chloride (KLOR-CON) 10 MEQ tablet Take 1 tablet (10 mEq total) by mouth daily. 11/29/22   Jodelle Gross, NP    Physical Exam: Vitals:   12/12/22 1835 12/12/22 2045 12/12/22 2057 12/12/22 2308  BP: 114/62 (!) 128/57    Pulse: 61 (!) 55    Resp: 19 19    Temp: (!) 97.5 F (36.4  C)   98.2 F (36.8 C)  TempSrc:    Oral  SpO2: 93% 96%    Weight:   70.6 kg   Height:   5\' 5"  (1.651 m)    General: Alert, no acute distress, frail appearing, pleasant  Cardio: Normal S1 and S2, RRR, no r/m/g Pulm: CTAB, normal work of breathing Abdomen: Bowel sounds normal. Abdomen soft and non-tender.  Extremities: No peripheral edema.  Neuro: Cranial nerves grossly intact    Data Reviewed:  There are no new results to review at this time.  Assessment and Plan:  Syncopal episode Broad differentials include infection, vasovagal, orthostatic hypotension, dehydration, arrhythmia etc Low suspicion for ACS. EKG shows sinus rhythm, prolonged PR interval. Trop 16>15. CT head neg.  -Admit to medical telemetry -Vitals per floor routine -Further 500cc bolus  -Continue mIVF  -Orthostatic blood pressures  -Follow-up echo -Repeat EKG am -CBC CMP a.m. -Heparin for DVT prophylaxis  Possible UTI Pt denies sx however could have been contributing to her recent symptoms . UA: large leuks, bacteria,  -Will treat empirically with IV CTX -Follow up urine culture   AKI  Likely pre-renal etiology. Cr 2.33, GFR 20, baseline 1.3-1.4. S/p 1LR in the ER.  -Avoid nephrotoxic agents -mIVF  Neck pain CT c-spine: Degenerative changes throughout the cervical spine likely account for anterior subluxations at C4-5 and C7-T1. No acute displaced fractures are identified. Pt received steroid injection today with some relief in symptoms  -Tylenol 650mg  Q6PRN  CAD -Continue ASA   HLD -Continue lipitor, Zetia  HTN BP 114/62 -Hold carvedilol, Lotrel and HCZT given soft Bps  Normocytic anemia Hemoglobin 11.5 on admission, MCV 90, baseline unclear.  No signs of bleeding on exam -Continue to monitor CBC  Advance Care Planning:   Code Status: Not on file FULL   Consults:   Family Communication:   Severity of Illness: The appropriate patient status for this patient is INPATIENT. Inpatient  status is judged to be reasonable and necessary in order to provide the required intensity of service to ensure the patient's safety. The patient's presenting symptoms, physical exam findings, and initial radiographic and laboratory data in the context of their chronic comorbidities is felt to place them at high risk for further clinical deterioration. Furthermore, it is not anticipated that the patient will be medically stable for discharge from the hospital within 2 midnights of admission.   * I certify that at the point of admission it is my clinical judgment that the patient will require inpatient hospital care spanning beyond 2 midnights from the point of admission due to high intensity of service, high risk for further deterioration and high frequency of surveillance required.*  Author: Rolm Gala, MD 12/12/2022 11:09 PM  For on call review [email or web address].

## 2022-12-13 ENCOUNTER — Inpatient Hospital Stay (HOSPITAL_COMMUNITY): Payer: Medicare HMO

## 2022-12-13 ENCOUNTER — Other Ambulatory Visit: Payer: Self-pay

## 2022-12-13 DIAGNOSIS — N1832 Chronic kidney disease, stage 3b: Secondary | ICD-10-CM

## 2022-12-13 DIAGNOSIS — R55 Syncope and collapse: Secondary | ICD-10-CM

## 2022-12-13 DIAGNOSIS — N179 Acute kidney failure, unspecified: Secondary | ICD-10-CM | POA: Diagnosis not present

## 2022-12-13 DIAGNOSIS — I10 Essential (primary) hypertension: Secondary | ICD-10-CM | POA: Diagnosis not present

## 2022-12-13 DIAGNOSIS — I25810 Atherosclerosis of coronary artery bypass graft(s) without angina pectoris: Secondary | ICD-10-CM

## 2022-12-13 LAB — CBC
HCT: 32.2 % — ABNORMAL LOW (ref 36.0–46.0)
HCT: 30.3 % — ABNORMAL LOW (ref 36.0–46.0)
Hemoglobin: 10.9 g/dL — ABNORMAL LOW (ref 12.0–15.0)
Hemoglobin: 10.5 g/dL — ABNORMAL LOW (ref 12.0–15.0)
MCH: 30.6 pg (ref 26.0–34.0)
MCH: 31.5 pg (ref 26.0–34.0)
MCHC: 33.9 g/dL (ref 30.0–36.0)
MCHC: 34.7 g/dL (ref 30.0–36.0)
MCV: 90.4 fL (ref 80.0–100.0)
MCV: 91 fL (ref 80.0–100.0)
Platelets: 211 10*3/uL (ref 150–400)
Platelets: 200 10*3/uL (ref 150–400)
RBC: 3.56 MIL/uL — ABNORMAL LOW (ref 3.87–5.11)
RBC: 3.33 MIL/uL — ABNORMAL LOW (ref 3.87–5.11)
RDW: 12.6 % (ref 11.5–15.5)
RDW: 12.6 % (ref 11.5–15.5)
WBC: 6.5 10*3/uL (ref 4.0–10.5)
WBC: 5 10*3/uL (ref 4.0–10.5)
nRBC: 0 % (ref 0.0–0.2)
nRBC: 0 % (ref 0.0–0.2)

## 2022-12-13 LAB — ECHOCARDIOGRAM COMPLETE
AR max vel: 2.67 cm2
AV Area VTI: 2.63 cm2
AV Area mean vel: 2.68 cm2
AV Mean grad: 5.5 mmHg
AV Peak grad: 9.6 mmHg
Ao pk vel: 1.55 m/s
Area-P 1/2: 3.77 cm2
Height: 65 in
S' Lateral: 2.9 cm
Weight: 2490.32 oz

## 2022-12-13 LAB — URINALYSIS, ROUTINE W REFLEX MICROSCOPIC
Bilirubin Urine: NEGATIVE
Glucose, UA: NEGATIVE mg/dL
Hgb urine dipstick: NEGATIVE
Ketones, ur: NEGATIVE mg/dL
Nitrite: NEGATIVE
Protein, ur: NEGATIVE mg/dL
Specific Gravity, Urine: 1.011 (ref 1.005–1.030)
WBC, UA: >50 WBC/hpf (ref 0–5)
pH: 5 (ref 5.0–8.0)

## 2022-12-13 LAB — CREATININE, SERUM
Creatinine, Ser: 2.33 mg/dL — ABNORMAL HIGH (ref 0.44–1.00)
GFR, Estimated: 20 mL/min — ABNORMAL LOW (ref >60)

## 2022-12-13 LAB — COMPREHENSIVE METABOLIC PANEL
ALT: 20 U/L (ref 0–44)
AST: 23 U/L (ref 15–41)
Albumin: 2.9 g/dL — ABNORMAL LOW (ref 3.5–5.0)
Alkaline Phosphatase: 63 U/L (ref 38–126)
Anion gap: 13 (ref 5–15)
BUN: 41 mg/dL — ABNORMAL HIGH (ref 8–23)
CO2: 22 mmol/L (ref 22–32)
Calcium: 8.6 mg/dL — ABNORMAL LOW (ref 8.9–10.3)
Chloride: 98 mmol/L (ref 98–111)
Creatinine, Ser: 2.16 mg/dL — ABNORMAL HIGH (ref 0.44–1.00)
GFR, Estimated: 22 mL/min — ABNORMAL LOW (ref >60)
Glucose, Bld: 180 mg/dL — ABNORMAL HIGH (ref 70–99)
Potassium: 3.3 mmol/L — ABNORMAL LOW (ref 3.5–5.1)
Sodium: 133 mmol/L — ABNORMAL LOW (ref 135–145)
Total Bilirubin: 1.2 mg/dL (ref 0.3–1.2)
Total Protein: 7.2 g/dL (ref 6.5–8.1)

## 2022-12-13 MED ORDER — ATORVASTATIN CALCIUM 80 MG PO TABS
80.0000 mg | ORAL_TABLET | Freq: Every day | ORAL | Status: DC
  Administered 2022-12-13 – 2022-12-14 (×2): 80 mg via ORAL
  Filled 2022-12-13 (×3): qty 1

## 2022-12-13 MED ORDER — POTASSIUM CHLORIDE CRYS ER 20 MEQ PO TBCR
40.0000 meq | EXTENDED_RELEASE_TABLET | ORAL | Status: AC
  Administered 2022-12-13: 40 meq via ORAL
  Filled 2022-12-13: qty 2

## 2022-12-13 MED ORDER — ACETAMINOPHEN 650 MG RE SUPP: 650.0000 mg | Freq: Four times a day (QID) | RECTAL | Status: DC | PRN

## 2022-12-13 MED ORDER — SERTRALINE HCL 25 MG PO TABS
25.0000 mg | ORAL_TABLET | Freq: Every day | ORAL | Status: DC
  Administered 2022-12-13 – 2022-12-14 (×2): 25 mg via ORAL
  Filled 2022-12-13 (×3): qty 1

## 2022-12-13 MED ORDER — LACTATED RINGERS IV SOLN: INTRAVENOUS | Status: DC

## 2022-12-13 MED ORDER — LACTATED RINGERS IV BOLUS
500.0000 mL | Freq: Once | INTRAVENOUS | Status: AC
  Administered 2022-12-13: 500 mL via INTRAVENOUS

## 2022-12-13 MED ORDER — SODIUM CHLORIDE 0.9 % IV SOLN
1.0000 g | Freq: Every day | INTRAVENOUS | Status: DC
  Administered 2022-12-13: 1 g via INTRAVENOUS
  Filled 2022-12-13: qty 10

## 2022-12-13 MED ORDER — EZETIMIBE 10 MG PO TABS
10.0000 mg | ORAL_TABLET | Freq: Every day | ORAL | Status: DC
  Administered 2022-12-13 – 2022-12-14 (×2): 10 mg via ORAL
  Filled 2022-12-13 (×2): qty 1

## 2022-12-13 MED ORDER — ACETAMINOPHEN 325 MG PO TABS: 650.0000 mg | ORAL_TABLET | Freq: Four times a day (QID) | ORAL | Status: DC | PRN

## 2022-12-13 MED ORDER — CARVEDILOL 12.5 MG PO TABS
12.5000 mg | ORAL_TABLET | Freq: Two times a day (BID) | ORAL | Status: DC
  Administered 2022-12-13 – 2022-12-14 (×2): 12.5 mg via ORAL
  Filled 2022-12-13 (×2): qty 1

## 2022-12-13 MED ORDER — AMLODIPINE BESYLATE 10 MG PO TABS
10.0000 mg | ORAL_TABLET | Freq: Every day | ORAL | Status: DC
  Administered 2022-12-13 – 2022-12-14 (×2): 10 mg via ORAL
  Filled 2022-12-13 (×2): qty 1

## 2022-12-13 MED ORDER — ASPIRIN 81 MG PO TBEC
81.0000 mg | DELAYED_RELEASE_TABLET | Freq: Every day | ORAL | Status: DC
  Administered 2022-12-13 – 2022-12-14 (×2): 81 mg via ORAL
  Filled 2022-12-13 (×3): qty 1

## 2022-12-13 MED ORDER — HEPARIN SODIUM (PORCINE) 5000 UNIT/ML IJ SOLN
5000.0000 [IU] | Freq: Three times a day (TID) | INTRAMUSCULAR | Status: DC
  Administered 2022-12-13 – 2022-12-15 (×5): 5000 [IU] via SUBCUTANEOUS
  Filled 2022-12-13 (×6): qty 1

## 2022-12-13 NOTE — Progress Notes (Addendum)
PROGRESS NOTE    ANGELLE ISAIS  Schroeder:[id number] DOB: 01-18-1941 DOA: 12/12/2022 PCP: Verlon Au, MD  Subjective: Admitted yesterday due to altered mental status. 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 Started on Ivf. Family at bedside. Pt states she feels much better. Scr 2.33-->2.16.   Hospital Course: HPI: Sophia Schroeder is a 82 y.o. female with medical history significant of CAD with CABG X 4 , HTN, HLD presents with syncopal episode. She went to see her PCP today and was a given a steroid shot for neck pain . After she went home she went onto have a syncopal episode which was witnessed by her son. He found her slumped in a chair with her eyes open, mouth moving but no words coming out so he called 911. This episode lasted 30 seconds.No LOC. Of note pt also has been feeling dizziness for the last few days, has been off her food and had a fall at home. Denies seizures, post ictal state, nausea, vomiting, headache, vision changes, speech changes, unilateral limb weakness, paraesthesia/numbness, ataxia or urinary/bowel incontinence.  Denies chest pain, palpitations, cough, dyspnea or fevers.    Imaging CXR: Mild cardiomegaly CT head: No evidence of acute infarction  CT c-spine: Degenerative changes throughout the cervical spine likely account for anterior subluxations at C4-5 and C7-T1. No acute displaced fractures are identified. Patchy infiltrates in the right lung may indicate pneumonia. No acute intracranial abnormalities.  Mild cerebral atrophy.    Significant Events: Admitted 12/12/2022   Significant Labs: Admission Scr 2.33  Significant Imaging Studies: CT head negative for acute intra-cranial abnormalities  Antibiotic Therapy:   Procedures:   Consultants:     Assessment and Plan: * Syncope and collapse Likely due to AKI/dehydration.  AKI (acute kidney injury) (HCC) Admission Scr 2.3. baseline Scr 1.4 back in 01-2022. Scr improved some with IVF. Discussed with pt  and her family. Will stop hydrochlorothiazide. Should not be restarted at discharge. Continue with IVF. Repeat BMP in AM. Pt had nephrology appointment next week with [facility].  CAD, AUTOLOGOUS BYPASS GRAFT Stable on ASA 81 mg, lipitor 80 mg and zetia 10 mg.  HYPERTENSION, BENIGN Stable. Continue coreg 18.75 mg bid. Stop hydrochlorothiazide at discharge. Probably needs to stop ARB as well until seen by nephrology.  Stage 3b chronic kidney disease (HCC) - baseline SCr 1.4 Acutely worsened. Baseline Scr 1.4. continue with IVF and repeat BMP in AM.    DVT prophylaxis: heparin injection 5,000 Units Start: 12/13/22 1400 SCDs Start: 12/13/22 0005    Code Status: Full Code Family Communication: discussed with pt, son and dtr at bedside Disposition Plan: return home Reason for continuing need for hospitalization: continued need for IVF due to dizziness/poor po intake.  Objective: Vitals:   12/13/22 0630 12/13/22 0715 12/13/22 1030 12/13/22 1036  BP: (!) 124/52 (!) 120/52 120/77   Pulse: 65 67 70   Resp: 16 18 14    Temp: 98.2 F (36.8 C)   98.3 F (36.8 C)  TempSrc: Oral   Oral  SpO2: 93% 94% 100%   Weight:      Height:        Intake/Output Summary (Last 24 hours) at 12/13/2022 1140 Last data filed at 12/13/2022 0910 Gross per 24 hour  Intake 863.5 ml  Output --  Net 863.5 ml   Filed Weights   12/12/22 2057  Weight: 70.6 kg    Examination:  Physical Exam Vitals and nursing note reviewed.  Constitutional:  General: She is not in acute distress.    Appearance: Normal appearance. She is not toxic-appearing or diaphoretic.  HENT:     Head: Normocephalic and atraumatic.     Nose: Nose normal.  Cardiovascular:     Rate and Rhythm: Normal rate and regular rhythm.     Pulses: Normal pulses.  Pulmonary:     Effort: Pulmonary effort is normal. No respiratory distress.     Breath sounds: Normal breath sounds.  Abdominal:     General: Bowel sounds are normal. There is  no distension.  Skin:    Capillary Refill: Capillary refill takes less than 2 seconds.  Neurological:     General: No focal deficit present.     Mental Status: She is alert and oriented to person, place, and time.     Data Reviewed: I have personally reviewed following labs and imaging studies  CBC: Recent Labs  Lab 12/12/22 1849 12/13/22 0113 12/13/22 0337  WBC 10.1 6.5 5.0  HGB 11.5* 10.9* 10.5*  HCT 34.5* 32.2* 30.3*  MCV 90.6 90.4 91.0  PLT 204 211 200   Basic Metabolic Panel: Recent Labs  Lab 12/12/22 1849 12/13/22 0113 12/13/22 0337  NA 134*  --  133*  K 3.5  --  3.3*  CL 98  --  98  CO2 20*  --  22  GLUCOSE 159*  --  180*  BUN 38*  --  41*  CREATININE 2.33* 2.33* 2.16*  CALCIUM 8.7*  --  8.6*   GFR: Estimated Creatinine Clearance: 19.4 mL/min (A) (by C-G formula based on SCr of 2.16 mg/dL (H)). Liver Function Tests: Recent Labs  Lab 12/12/22 1849 12/13/22 0337  AST 24 23  ALT 19 20  ALKPHOS 68 63  BILITOT 1.7* 1.2  PROT 8.1 7.2  ALBUMIN 3.3* 2.9*    Recent Labs  Lab 12/12/22 2150  AMMONIA <10    No results found for this or any previous visit (from the past 240 hour(s)).   Radiology Studies: DG Chest Portable 1 View  Result Date: 12/12/2022 CLINICAL DATA:  Poor appetite weakness EXAM: PORTABLE CHEST 1 VIEW COMPARISON:  10/15/2001 FINDINGS: Post sternotomy changes. Mild cardiomegaly. No acute airspace disease or pleural effusion. Aortic atherosclerosis. IMPRESSION: Mild cardiomegaly. Electronically Signed   By: Jasmine Pang M.D.   On: 12/12/2022 21:38   CT HEAD WO CONTRAST  Result Date: 12/12/2022 CLINICAL DATA:  Syncope or presyncope with cerebrovascular cause suspected. Loss of appetite and weakness. Recent steroid shot for neck pain. EXAM: CT HEAD WITHOUT CONTRAST CT CERVICAL SPINE WITHOUT CONTRAST TECHNIQUE: Multidetector CT imaging of the head and cervical spine was performed following the standard protocol without intravenous contrast.  Multiplanar CT image reconstructions of the cervical spine were also generated. RADIATION DOSE REDUCTION: This exam was performed according to the departmental dose-optimization program which includes automated exposure control, adjustment of the mA and/or kV according to patient size and/or use of iterative reconstruction technique. COMPARISON:  None Available. FINDINGS: CT HEAD FINDINGS Brain: No evidence of acute infarction, hemorrhage, hydrocephalus, extra-axial collection or mass lesion/mass effect. Mild cerebral atrophy. Vascular: No hyperdense vessel or unexpected calcification. Skull: Normal. Negative for fracture or focal lesion. Sinuses/Orbits: No acute finding. Other: None. CT CERVICAL SPINE FINDINGS Alignment: Slight anterior subluxations demonstrated at C4-5 and C7-T1. These changes are nonspecific but most likely degenerative. Normal alignment of the posterior elements. C1-2 articulation appears intact. Skull base and vertebrae: Skull base appears intact. No vertebral compression deformities. No focal bone lesion or  bone destruction. Soft tissues and spinal canal: No prevertebral soft tissue swelling. No abnormal paraspinal soft tissue mass or infiltration. Disc levels: Degenerative changes throughout with disc space narrowing and endplate osteophyte formation. Degenerative changes are most prominent at C5-6 and C6-7 levels. Prominent degenerative changes are noted in the upper thoracic spine with bridging anterior osteophytes. Degenerative changes throughout the facet joints. Degenerative changes in the temporomandibular joints. Upper chest: Patchy infiltrates are suggested in the right lung, possibly indicating pneumonia. Aspiration could be a secondary consideration in the appropriate clinical setting. Sternotomy wires. Aortic calcification. Other: None. IMPRESSION: 1. Degenerative changes throughout the cervical spine likely account for anterior subluxations at C4-5 and C7-T1. No acute displaced  fractures are identified. 2. Patchy infiltrates in the right lung may indicate pneumonia. 3. No acute intracranial abnormalities.  Mild cerebral atrophy. Electronically Signed   By: Burman Nieves M.D.   On: 12/12/2022 20:01   CT Cervical Spine Wo Contrast  Result Date: 12/12/2022 CLINICAL DATA:  Syncope or presyncope with cerebrovascular cause suspected. Loss of appetite and weakness. Recent steroid shot for neck pain. EXAM: CT HEAD WITHOUT CONTRAST CT CERVICAL SPINE WITHOUT CONTRAST TECHNIQUE: Multidetector CT imaging of the head and cervical spine was performed following the standard protocol without intravenous contrast. Multiplanar CT image reconstructions of the cervical spine were also generated. RADIATION DOSE REDUCTION: This exam was performed according to the departmental dose-optimization program which includes automated exposure control, adjustment of the mA and/or kV according to patient size and/or use of iterative reconstruction technique. COMPARISON:  None Available. FINDINGS: CT HEAD FINDINGS Brain: No evidence of acute infarction, hemorrhage, hydrocephalus, extra-axial collection or mass lesion/mass effect. Mild cerebral atrophy. Vascular: No hyperdense vessel or unexpected calcification. Skull: Normal. Negative for fracture or focal lesion. Sinuses/Orbits: No acute finding. Other: None. CT CERVICAL SPINE FINDINGS Alignment: Slight anterior subluxations demonstrated at C4-5 and C7-T1. These changes are nonspecific but most likely degenerative. Normal alignment of the posterior elements. C1-2 articulation appears intact. Skull base and vertebrae: Skull base appears intact. No vertebral compression deformities. No focal bone lesion or bone destruction. Soft tissues and spinal canal: No prevertebral soft tissue swelling. No abnormal paraspinal soft tissue mass or infiltration. Disc levels: Degenerative changes throughout with disc space narrowing and endplate osteophyte formation. Degenerative  changes are most prominent at C5-6 and C6-7 levels. Prominent degenerative changes are noted in the upper thoracic spine with bridging anterior osteophytes. Degenerative changes throughout the facet joints. Degenerative changes in the temporomandibular joints. Upper chest: Patchy infiltrates are suggested in the right lung, possibly indicating pneumonia. Aspiration could be a secondary consideration in the appropriate clinical setting. Sternotomy wires. Aortic calcification. Other: None. IMPRESSION: 1. Degenerative changes throughout the cervical spine likely account for anterior subluxations at C4-5 and C7-T1. No acute displaced fractures are identified. 2. Patchy infiltrates in the right lung may indicate pneumonia. 3. No acute intracranial abnormalities.  Mild cerebral atrophy. Electronically Signed   By: Burman Nieves M.D.   On: 12/12/2022 20:01    Scheduled Meds:  aspirin EC  81 mg Oral Daily   atorvastatin  80 mg Oral Daily   ezetimibe  10 mg Oral Daily   heparin  5,000 Units Subcutaneous Q8H   Continuous Infusions:  cefTRIAXone (ROCEPHIN)  IV Stopped (12/13/22 0305)   lactated ringers 100 mL/hr at 12/13/22 1102     LOS: 1 day   Time spent: 40 minutes  Carollee Herter, DO  [facility]  12/13/2022, 11:40 AM

## 2022-12-13 NOTE — Assessment & Plan Note (Signed)
Admission Scr 2.3. baseline Scr 1.4 back in 01-2022. Scr improved some with IVF. Discussed with pt and her family. Will stop hydrochlorothiazide. Should not be restarted at discharge. Continue with IVF. Repeat BMP in AM. Pt had nephrology appointment next week with [facility].

## 2022-12-13 NOTE — ED Notes (Signed)
ED TO INPATIENT HANDOFF REPORT  ED Nurse Name and Phone #: [phone]  S Name/Age/Gender Sophia Schroeder 82 y.o. female Room/Bed: 024C/024C  Code Status   Code Status: Full Code  Home/SNF/Other Home Patient oriented to: self, place, time, and situation Is this baseline? Yes   Triage Complete: Triage complete  Chief Complaint Syncope and collapse [R55]  Triage Note Pt's DIL reports that the patient has not had a good appetite for three days and has been weaker than normal. They had an appointment with PCP today and was given a steroid shot for neck pain and had blood work drawn. They took the patient home and while in the recliner she had two episodes of having her eyes open and looking at family but not responding or communicating with them. Patient is now A/O x 4 but states she feels a little "off."    Allergies No Known Allergies  Level of Care/Admitting Diagnosis ED Disposition     ED Disposition  Admit   Condition  --   Comment  Hospital Area: [facility] [[id number]]  Level of Care: Telemetry Medical [104]  May admit patient to [facility] or [facility] if equivalent level of care is available:: No  Covid Evaluation: Asymptomatic - no recent exposure (last 10 days) testing not required  Diagnosis: Syncope and collapse [780.2.ICD-9-CM]  Admitting Physician: Cathleen Corti [[id number]]  Attending Physician: Cathleen Corti [[id number]]  Certification:: I certify this patient will need inpatient services for at least 2 midnights  Estimated Length of Stay: 2          B Medical/Surgery History Past Medical History:  Diagnosis Date   Benign hypertension    Carotid artery stenosis    without infarction   Coronary atherosclerosis of autologous vein bypass graft    Mixed hyperlipidemia    Past Surgical History:  Procedure Laterality Date   CATARACT EXTRACTION  june 2013   CORONARY ARTERY BYPASS GRAFT  10/10/2001   NOSE SURGERY     right  total hip arthroplasty  03/11/2002   TUBAL LIGATION       A IV Location/Drains/Wounds Patient Lines/Drains/Airways Status     Active Line/Drains/Airways     Name Placement date Placement time Site Days   Peripheral IV 12/12/22 18 G Anterior;Left;Proximal Forearm 12/12/22  2151  Forearm  1            Intake/Output Last 24 hours No intake or output data in the 24 hours ending 12/13/22 0022  Labs/Imaging Results for orders placed or performed during the hospital encounter of 12/12/22 (from the past 48 hour(s))  Urinalysis, Routine w reflex microscopic -Urine, Clean Catch     Status: Abnormal   Collection Time: 12/12/22  6:42 PM  Result Value Ref Range   Color, Urine YELLOW YELLOW   APPearance HAZY (A) CLEAR   Specific Gravity, Urine 1.011 1.005 - 1.030   pH 5.0 5.0 - 8.0   Glucose, UA NEGATIVE NEGATIVE mg/dL   Hgb urine dipstick NEGATIVE NEGATIVE   Bilirubin Urine NEGATIVE NEGATIVE   Ketones, ur NEGATIVE NEGATIVE mg/dL   Protein, ur NEGATIVE NEGATIVE mg/dL   Nitrite NEGATIVE NEGATIVE   Leukocytes,Ua LARGE (A) NEGATIVE   RBC / HPF 11-20 0 - 5 RBC/hpf   WBC, UA >50 0 - 5 WBC/hpf   Bacteria, UA FEW (A) NONE SEEN   Squamous Epithelial / HPF 11-20 0 - 5 /HPF   Mucus PRESENT    Hyaline Casts, UA PRESENT  Non Squamous Epithelial 0-5 (A) NONE SEEN    Comment: Performed at [facility], [address], [place], [place] [address]  Basic metabolic panel     Status: Abnormal   Collection Time: 12/12/22  6:49 PM  Result Value Ref Range   Sodium 134 (L) 135 - 145 mmol/L   Potassium 3.5 3.5 - 5.1 mmol/L   Chloride 98 98 - 111 mmol/L   CO2 20 (L) 22 - 32 mmol/L   Glucose, Bld 159 (H) 70 - 99 mg/dL    Comment: Glucose reference range applies only to samples taken after fasting for at least 8 hours.   BUN 38 (H) 8 - 23 mg/dL   Creatinine, Ser 0.34 (H) 0.44 - 1.00 mg/dL   Calcium 8.7 (L) 8.9 - 10.3 mg/dL   GFR, Estimated 20 (L) >60 mL/min    Comment: (NOTE) Calculated  using the CKD-EPI Creatinine Equation (2021)    Anion gap 16 (H) 5 - 15    Comment: Performed at [facility], [address] [place], [place] [address]  CBC     Status: Abnormal   Collection Time: 12/12/22  6:49 PM  Result Value Ref Range   WBC 10.1 4.0 - 10.5 K/uL   RBC 3.81 (L) 3.87 - 5.11 MIL/uL   Hemoglobin 11.5 (L) 12.0 - 15.0 g/dL   HCT 56.3 (L) 87.5 - 64.3 %   MCV 90.6 80.0 - 100.0 fL   MCH 30.2 26.0 - 34.0 pg   MCHC 33.3 30.0 - 36.0 g/dL   RDW 32.9 51.8 - 84.1 %   Platelets 204 150 - 400 K/uL   nRBC 0.0 0.0 - 0.2 %    Comment: Performed at [facility], [address] [place], [place] [address]  Troponin I (High Sensitivity)     Status: None   Collection Time: 12/12/22  6:49 PM  Result Value Ref Range   Troponin I (High Sensitivity) 16 <18 ng/L    Comment: (NOTE) Elevated high sensitivity troponin I (hsTnI) values and significant  changes across serial measurements may suggest ACS but many other  chronic and acute conditions are known to elevate hsTnI results.  Refer to the "Links" section for chest pain algorithms and additional  guidance. Performed at [facility], [address] [place], [place] [address]   Hepatic function panel     Status: Abnormal   Collection Time: 12/12/22  6:49 PM  Result Value Ref Range   Total Protein 8.1 6.5 - 8.1 g/dL   Albumin 3.3 (L) 3.5 - 5.0 g/dL   AST 24 15 - 41 U/L   ALT 19 0 - 44 U/L   Alkaline Phosphatase 68 38 - 126 U/L   Total Bilirubin 1.7 (H) 0.3 - 1.2 mg/dL   Bilirubin, Direct 0.2 0.0 - 0.2 mg/dL   Indirect Bilirubin 1.5 (H) 0.3 - 0.9 mg/dL    Comment: Performed at [facility], [address], [place], [place] [address]  Troponin I (High Sensitivity)     Status: None   Collection Time: 12/12/22  9:40 PM  Result Value Ref Range   Troponin I (High Sensitivity) 15 <18 ng/L    Comment: (NOTE) Elevated high sensitivity troponin I (hsTnI) values and significant  changes across serial  measurements may suggest ACS but many other  chronic and acute conditions are known to elevate hsTnI results.  Refer to the "Links" section for chest pain algorithms and additional  guidance.  Performed at [facility], [address] [place], [place] [address]   Ammonia     Status: None   Collection Time: 12/12/22  9:50 PM  Result Value Ref Range   Ammonia <10 9 - 35 umol/L    Comment: Performed at [facility], [address], [place], [place] [address]   DG Chest Portable 1 View  Result Date: 12/12/2022 CLINICAL DATA:  Poor appetite weakness EXAM: PORTABLE CHEST 1 VIEW COMPARISON:  10/15/2001 FINDINGS: Post sternotomy changes. Mild cardiomegaly. No acute airspace disease or pleural effusion. Aortic atherosclerosis. IMPRESSION: Mild cardiomegaly. Electronically Signed   By: Jasmine Pang M.D.   On: 12/12/2022 21:38   CT HEAD WO CONTRAST  Result Date: 12/12/2022 CLINICAL DATA:  Syncope or presyncope with cerebrovascular cause suspected. Loss of appetite and weakness. Recent steroid shot for neck pain. EXAM: CT HEAD WITHOUT CONTRAST CT CERVICAL SPINE WITHOUT CONTRAST TECHNIQUE: Multidetector CT imaging of the head and cervical spine was performed following the standard protocol without intravenous contrast. Multiplanar CT image reconstructions of the cervical spine were also generated. RADIATION DOSE REDUCTION: This exam was performed according to the departmental dose-optimization program which includes automated exposure control, adjustment of the mA and/or kV according to patient size and/or use of iterative reconstruction technique. COMPARISON:  None Available. FINDINGS: CT HEAD FINDINGS Brain: No evidence of acute infarction, hemorrhage, hydrocephalus, extra-axial collection or mass lesion/mass effect. Mild cerebral atrophy. Vascular: No hyperdense vessel or unexpected calcification. Skull: Normal. Negative for fracture or focal lesion. Sinuses/Orbits: No acute finding. Other:  None. CT CERVICAL SPINE FINDINGS Alignment: Slight anterior subluxations demonstrated at C4-5 and C7-T1. These changes are nonspecific but most likely degenerative. Normal alignment of the posterior elements. C1-2 articulation appears intact. Skull base and vertebrae: Skull base appears intact. No vertebral compression deformities. No focal bone lesion or bone destruction. Soft tissues and spinal canal: No prevertebral soft tissue swelling. No abnormal paraspinal soft tissue mass or infiltration. Disc levels: Degenerative changes throughout with disc space narrowing and endplate osteophyte formation. Degenerative changes are most prominent at C5-6 and C6-7 levels. Prominent degenerative changes are noted in the upper thoracic spine with bridging anterior osteophytes. Degenerative changes throughout the facet joints. Degenerative changes in the temporomandibular joints. Upper chest: Patchy infiltrates are suggested in the right lung, possibly indicating pneumonia. Aspiration could be a secondary consideration in the appropriate clinical setting. Sternotomy wires. Aortic calcification. Other: None. IMPRESSION: 1. Degenerative changes throughout the cervical spine likely account for anterior subluxations at C4-5 and C7-T1. No acute displaced fractures are identified. 2. Patchy infiltrates in the right lung may indicate pneumonia. 3. No acute intracranial abnormalities.  Mild cerebral atrophy. Electronically Signed   By: Burman Nieves M.D.   On: 12/12/2022 20:01   CT Cervical Spine Wo Contrast  Result Date: 12/12/2022 CLINICAL DATA:  Syncope or presyncope with cerebrovascular cause suspected. Loss of appetite and weakness. Recent steroid shot for neck pain. EXAM: CT HEAD WITHOUT CONTRAST CT CERVICAL SPINE WITHOUT CONTRAST TECHNIQUE: Multidetector CT imaging of the head and cervical spine was performed following the standard protocol without intravenous contrast. Multiplanar CT image reconstructions of the  cervical spine were also generated. RADIATION DOSE REDUCTION: This exam was performed according to the departmental dose-optimization program which includes automated exposure control, adjustment of the mA and/or kV according to patient size and/or use of iterative reconstruction technique. COMPARISON:  None Available. FINDINGS: CT HEAD FINDINGS Brain: No evidence of acute infarction, hemorrhage, hydrocephalus, extra-axial collection or mass lesion/mass  effect. Mild cerebral atrophy. Vascular: No hyperdense vessel or unexpected calcification. Skull: Normal. Negative for fracture or focal lesion. Sinuses/Orbits: No acute finding. Other: None. CT CERVICAL SPINE FINDINGS Alignment: Slight anterior subluxations demonstrated at C4-5 and C7-T1. These changes are nonspecific but most likely degenerative. Normal alignment of the posterior elements. C1-2 articulation appears intact. Skull base and vertebrae: Skull base appears intact. No vertebral compression deformities. No focal bone lesion or bone destruction. Soft tissues and spinal canal: No prevertebral soft tissue swelling. No abnormal paraspinal soft tissue mass or infiltration. Disc levels: Degenerative changes throughout with disc space narrowing and endplate osteophyte formation. Degenerative changes are most prominent at C5-6 and C6-7 levels. Prominent degenerative changes are noted in the upper thoracic spine with bridging anterior osteophytes. Degenerative changes throughout the facet joints. Degenerative changes in the temporomandibular joints. Upper chest: Patchy infiltrates are suggested in the right lung, possibly indicating pneumonia. Aspiration could be a secondary consideration in the appropriate clinical setting. Sternotomy wires. Aortic calcification. Other: None. IMPRESSION: 1. Degenerative changes throughout the cervical spine likely account for anterior subluxations at C4-5 and C7-T1. No acute displaced fractures are identified. 2. Patchy  infiltrates in the right lung may indicate pneumonia. 3. No acute intracranial abnormalities.  Mild cerebral atrophy. Electronically Signed   By: Burman Nieves M.D.   On: 12/12/2022 20:01    Pending Labs Unresulted Labs (From admission, onward)     Start     Ordered   12/13/22 0500  CBC  Tomorrow morning,   R        12/13/22 0010   12/13/22 0500  Comprehensive metabolic panel  Tomorrow morning,   R        12/13/22 0010   12/13/22 0007  CBC  (heparin)  Once,   R       Comments: Baseline for heparin therapy IF NOT ALREADY DRAWN.  Notify MD if PLT < 100 K.    12/13/22 0010   12/13/22 0007  Creatinine, serum  (heparin)  Once,   R       Comments: Baseline for heparin therapy IF NOT ALREADY DRAWN.    12/13/22 0010            Vitals/Pain Today's Vitals   12/12/22 1838 12/12/22 2045 12/12/22 2057 12/12/22 2308  BP:  (!) 128/57    Pulse:  (!) 55    Resp:  19    Temp:    98.2 F (36.8 C)  TempSrc:    Oral  SpO2:  96%    Weight:   70.6 kg   Height:   5\' 5"  (1.651 m)   PainSc: 0-No pain       Isolation Precautions No active isolations  Medications Medications  lactated ringers infusion (has no administration in time range)  aspirin EC tablet 81 mg (has no administration in time range)  atorvastatin (LIPITOR) tablet 80 mg (has no administration in time range)  ezetimibe (ZETIA) tablet 10 mg (has no administration in time range)  heparin injection 5,000 Units (has no administration in time range)  acetaminophen (TYLENOL) tablet 650 mg (has no administration in time range)    Or  acetaminophen (TYLENOL) suppository 650 mg (has no administration in time range)  lactated ringers bolus 1,000 mL (1,000 mLs Intravenous New Bag/Given 12/12/22 2152)    Mobility walks     Focused Assessments Cardiac Assessment Handoff:    No results found for: "CKTOTAL", "CKMB", "CKMBINDEX", "TROPONINI" No results found for: "DDIMER" Does the Patient currently have  chest pain? No     R Recommendations: See Admitting Provider Note  Report given to:   Additional Notes:

## 2022-12-13 NOTE — Assessment & Plan Note (Signed)
Stable on ASA 81 mg, lipitor 80 mg and zetia 10 mg.

## 2022-12-13 NOTE — ED Notes (Signed)
Pt assisted to bathroom, pt able to ambulate with steady gait. Denies any pain or dizziness at this time.

## 2022-12-13 NOTE — Hospital Course (Addendum)
HPI: Sophia Schroeder is a 82 y.o. female with medical history significant of CAD with CABG X 4 , HTN, HLD presents with syncopal episode. She went to see her PCP today and was a given a steroid shot for neck pain . After she went home she went onto have a syncopal episode which was witnessed by her son. He found her slumped in a chair with her eyes open, mouth moving but no words coming out so he called 911. This episode lasted 30 seconds.No LOC. Of note pt also has been feeling dizziness for the last few days, has been off her food and had a fall at home. Denies seizures, post ictal state, nausea, vomiting, headache, vision changes, speech changes, unilateral limb weakness, paraesthesia/numbness, ataxia or urinary/bowel incontinence.  Denies chest pain, palpitations, cough, dyspnea or fevers.    Imaging CXR: Mild cardiomegaly CT head: No evidence of acute infarction  CT c-spine: Degenerative changes throughout the cervical spine likely account for anterior subluxations at C4-5 and C7-T1. No acute displaced fractures are identified. Patchy infiltrates in the right lung may indicate pneumonia. No acute intracranial abnormalities.  Mild cerebral atrophy.    Significant Events: Admitted 12/12/2022   Significant Labs: Admission Scr 2.33  Significant Imaging Studies: CT head negative for acute intra-cranial abnormalities Echo showed normal LVEF 65%. No shunt  Antibiotic Therapy:   Procedures:   Consultants:

## 2022-12-13 NOTE — Assessment & Plan Note (Signed)
Likely due to AKI/dehydration.

## 2022-12-13 NOTE — Assessment & Plan Note (Signed)
Acutely worsened. Baseline Scr 1.4. continue with IVF and repeat BMP in AM.

## 2022-12-13 NOTE — Progress Notes (Signed)
*  PRELIMINARY RESULTS* Echocardiogram 2D Echocardiogram has been performed.  Sophia Schroeder 12/13/2022, 12:25 PM

## 2022-12-13 NOTE — Subjective & Objective (Signed)
Admitted yesterday due to altered mental status. 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 Started on Ivf. Family at bedside. Pt states she feels much better. Scr 2.33-->2.16.

## 2022-12-13 NOTE — Assessment & Plan Note (Signed)
Stable. Continue coreg 18.75 mg bid. Stop hydrochlorothiazide at discharge. Probably needs to stop ARB as well until seen by nephrology.

## 2022-12-13 NOTE — ED Notes (Signed)
ED TO INPATIENT HANDOFF REPORT  ED Nurse Name and Phone #: Nehemiah Settle [phone]  S Name/Age/Gender Sophia Schroeder 82 y.o. female Room/Bed: 041C/041C  Code Status   Code Status: Full Code  Home/SNF/Other Home Patient oriented to: self, place, time, and situation Is this baseline? Yes   Triage Complete: Triage complete  Chief Complaint Syncope and collapse [R55]  Triage Note Pt's DIL reports that the patient has not had a good appetite for three days and has been weaker than normal. They had an appointment with PCP today and was given a steroid shot for neck pain and had blood work drawn. They took the patient home and while in the recliner she had two episodes of having her eyes open and looking at family but not responding or communicating with them. Patient is now A/O x 4 but states she feels a little "off."    Allergies No Known Allergies  Level of Care/Admitting Diagnosis ED Disposition     ED Disposition  Admit   Condition  --   Comment  Hospital Area: [facility] [[id number]]  Level of Care: Telemetry Medical [104]  May admit patient to [facility] or [facility] if equivalent level of care is available:: No  Covid Evaluation: Asymptomatic - no recent exposure (last 10 days) testing not required  Diagnosis: Syncope and collapse [780.2.ICD-9-CM]  Admitting Physician: Cathleen Corti [[id number]]  Attending Physician: Cathleen Corti [[id number]]  Certification:: I certify this patient will need inpatient services for at least 2 midnights  Estimated Length of Stay: 2          B Medical/Surgery History Past Medical History:  Diagnosis Date   Benign hypertension    Carotid artery stenosis    without infarction   Coronary atherosclerosis of autologous vein bypass graft    Mixed hyperlipidemia    Past Surgical History:  Procedure Laterality Date   CATARACT EXTRACTION  june 2013   CORONARY ARTERY BYPASS GRAFT  10/10/2001   NOSE SURGERY     right  total hip arthroplasty  03/11/2002   TUBAL LIGATION       A IV Location/Drains/Wounds Patient Lines/Drains/Airways Status     Active Line/Drains/Airways     Name Placement date Placement time Site Days   Peripheral IV 12/12/22 18 G Anterior;Left;Proximal Forearm 12/12/22  2151  Forearm  1            Intake/Output Last 24 hours  Intake/Output Summary (Last 24 hours) at 12/13/2022 1053 Last data filed at 12/13/2022 0910 Gross per 24 hour  Intake 863.5 ml  Output --  Net 863.5 ml    Labs/Imaging Results for orders placed or performed during the hospital encounter of 12/12/22 (from the past 48 hour(s))  Urinalysis, Routine w reflex microscopic -Urine, Clean Catch     Status: Abnormal   Collection Time: 12/12/22  6:42 PM  Result Value Ref Range   Color, Urine YELLOW YELLOW   APPearance HAZY (A) CLEAR   Specific Gravity, Urine 1.011 1.005 - 1.030   pH 5.0 5.0 - 8.0   Glucose, UA NEGATIVE NEGATIVE mg/dL   Hgb urine dipstick NEGATIVE NEGATIVE   Bilirubin Urine NEGATIVE NEGATIVE   Ketones, ur NEGATIVE NEGATIVE mg/dL   Protein, ur NEGATIVE NEGATIVE mg/dL   Nitrite NEGATIVE NEGATIVE   Leukocytes,Ua LARGE (A) NEGATIVE   RBC / HPF 11-20 0 - 5 RBC/hpf   WBC, UA >50 0 - 5 WBC/hpf   Bacteria, UA FEW (A) NONE SEEN  Squamous Epithelial / HPF 11-20 0 - 5 /HPF   Mucus PRESENT    Hyaline Casts, UA PRESENT    Non Squamous Epithelial 0-5 (A) NONE SEEN    Comment: Performed at [facility], [address] [place], [place] [address]  Basic metabolic panel     Status: Abnormal   Collection Time: 12/12/22  6:49 PM  Result Value Ref Range   Sodium 134 (L) 135 - 145 mmol/L   Potassium 3.5 3.5 - 5.1 mmol/L   Chloride 98 98 - 111 mmol/L   CO2 20 (L) 22 - 32 mmol/L   Glucose, Bld 159 (H) 70 - 99 mg/dL    Comment: Glucose reference range applies only to samples taken after fasting for at least 8 hours.   BUN 38 (H) 8 - 23 mg/dL   Creatinine, Ser 5.73 (H) 0.44 - 1.00 mg/dL    Calcium 8.7 (L) 8.9 - 10.3 mg/dL   GFR, Estimated 20 (L) >60 mL/min    Comment: (NOTE) Calculated using the CKD-EPI Creatinine Equation (2021)    Anion gap 16 (H) 5 - 15    Comment: Performed at [facility], [address], [place], [place] [address]  CBC     Status: Abnormal   Collection Time: 12/12/22  6:49 PM  Result Value Ref Range   WBC 10.1 4.0 - 10.5 K/uL   RBC 3.81 (L) 3.87 - 5.11 MIL/uL   Hemoglobin 11.5 (L) 12.0 - 15.0 g/dL   HCT 42.7 (L) 06.2 - 37.6 %   MCV 90.6 80.0 - 100.0 fL   MCH 30.2 26.0 - 34.0 pg   MCHC 33.3 30.0 - 36.0 g/dL   RDW 28.3 15.1 - 76.1 %   Platelets 204 150 - 400 K/uL   nRBC 0.0 0.0 - 0.2 %    Comment: Performed at [facility], [address] [place], [place] [address]  Troponin I (High Sensitivity)     Status: None   Collection Time: 12/12/22  6:49 PM  Result Value Ref Range   Troponin I (High Sensitivity) 16 <18 ng/L    Comment: (NOTE) Elevated high sensitivity troponin I (hsTnI) values and significant  changes across serial measurements may suggest ACS but many other  chronic and acute conditions are known to elevate hsTnI results.  Refer to the "Links" section for chest pain algorithms and additional  guidance. Performed at [facility], [address], [place], [place] [address]   Hepatic function panel     Status: Abnormal   Collection Time: 12/12/22  6:49 PM  Result Value Ref Range   Total Protein 8.1 6.5 - 8.1 g/dL   Albumin 3.3 (L) 3.5 - 5.0 g/dL   AST 24 15 - 41 U/L   ALT 19 0 - 44 U/L   Alkaline Phosphatase 68 38 - 126 U/L   Total Bilirubin 1.7 (H) 0.3 - 1.2 mg/dL   Bilirubin, Direct 0.2 0.0 - 0.2 mg/dL   Indirect Bilirubin 1.5 (H) 0.3 - 0.9 mg/dL    Comment: Performed at [facility], [address] [place], [place] [address]  Troponin I (High Sensitivity)     Status: None   Collection Time: 12/12/22  9:40 PM  Result Value Ref Range   Troponin I (High Sensitivity) 15 <18 ng/L    Comment:  (NOTE) Elevated high sensitivity troponin I (hsTnI) values and significant  changes across serial measurements may suggest ACS but many other  chronic  and acute conditions are known to elevate hsTnI results.  Refer to the "Links" section for chest pain algorithms and additional  guidance. Performed at [facility], [address], [place], [place] [address]   Ammonia     Status: None   Collection Time: 12/12/22  9:50 PM  Result Value Ref Range   Ammonia <10 9 - 35 umol/L    Comment: Performed at [facility], [address], [place], [place] [address]  CBC     Status: Abnormal   Collection Time: 12/13/22  1:13 AM  Result Value Ref Range   WBC 6.5 4.0 - 10.5 K/uL   RBC 3.56 (L) 3.87 - 5.11 MIL/uL   Hemoglobin 10.9 (L) 12.0 - 15.0 g/dL   HCT 78.2 (L) 95.6 - 21.3 %   MCV 90.4 80.0 - 100.0 fL   MCH 30.6 26.0 - 34.0 pg   MCHC 33.9 30.0 - 36.0 g/dL   RDW 08.6 57.8 - 46.9 %   Platelets 211 150 - 400 K/uL   nRBC 0.0 0.0 - 0.2 %    Comment: Performed at [facility], [address], [place], [place] [address]  Creatinine, serum     Status: Abnormal   Collection Time: 12/13/22  1:13 AM  Result Value Ref Range   Creatinine, Ser 2.33 (H) 0.44 - 1.00 mg/dL   GFR, Estimated 20 (L) >60 mL/min    Comment: (NOTE) Calculated using the CKD-EPI Creatinine Equation (2021) Performed at [facility], [address], [place], [place] [address]   CBC     Status: Abnormal   Collection Time: 12/13/22  3:37 AM  Result Value Ref Range   WBC 5.0 4.0 - 10.5 K/uL   RBC 3.33 (L) 3.87 - 5.11 MIL/uL   Hemoglobin 10.5 (L) 12.0 - 15.0 g/dL   HCT 44.0 (L) 10.2 - 72.5 %   MCV 91.0 80.0 - 100.0 fL   MCH 31.5 26.0 - 34.0 pg   MCHC 34.7 30.0 - 36.0 g/dL   RDW 36.6 44.0 - 34.7 %   Platelets 200 150 - 400 K/uL   nRBC 0.0 0.0 - 0.2 %    Comment: Performed at [facility], [address], [place], [place] [address]  Comprehensive metabolic panel     Status: Abnormal   Collection  Time: 12/13/22  3:37 AM  Result Value Ref Range   Sodium 133 (L) 135 - 145 mmol/L   Potassium 3.3 (L) 3.5 - 5.1 mmol/L   Chloride 98 98 - 111 mmol/L   CO2 22 22 - 32 mmol/L   Glucose, Bld 180 (H) 70 - 99 mg/dL    Comment: Glucose reference range applies only to samples taken after fasting for at least 8 hours.   BUN 41 (H) 8 - 23 mg/dL   Creatinine, Ser 6.38 (H) 0.44 - 1.00 mg/dL   Calcium 8.6 (L) 8.9 - 10.3 mg/dL   Total Protein 7.2 6.5 - 8.1 g/dL   Albumin 2.9 (L) 3.5 - 5.0 g/dL   AST 23 15 - 41 U/L   ALT 20 0 - 44 U/L   Alkaline Phosphatase 63 38 - 126 U/L   Total Bilirubin 1.2 0.3 - 1.2 mg/dL   GFR, Estimated 22 (L) >60 mL/min    Comment: (NOTE) Calculated using the CKD-EPI Creatinine Equation (2021)    Anion gap 13 5 - 15    Comment: Performed at [facility], [address] [place], [place] [address]   DG Chest Portable 1 View  Result Date: 12/12/2022 CLINICAL DATA:  Poor appetite weakness EXAM: PORTABLE CHEST 1 VIEW COMPARISON:  10/15/2001 FINDINGS: Post sternotomy changes. Mild cardiomegaly. No acute airspace disease or pleural effusion. Aortic atherosclerosis. IMPRESSION: Mild cardiomegaly. Electronically Signed   By: Jasmine Pang M.D.   On: 12/12/2022 21:38   CT HEAD WO CONTRAST  Result Date: 12/12/2022 CLINICAL DATA:  Syncope or presyncope with cerebrovascular cause suspected. Loss of appetite and weakness. Recent steroid shot for neck pain. EXAM: CT HEAD WITHOUT CONTRAST CT CERVICAL SPINE WITHOUT CONTRAST TECHNIQUE: Multidetector CT imaging of the head and cervical spine was performed following the standard protocol without intravenous contrast. Multiplanar CT image reconstructions of the cervical spine were also generated. RADIATION DOSE REDUCTION: This exam was performed according to the departmental dose-optimization program which includes automated exposure control, adjustment of the mA and/or kV according to patient size and/or use of iterative reconstruction  technique. COMPARISON:  None Available. FINDINGS: CT HEAD FINDINGS Brain: No evidence of acute infarction, hemorrhage, hydrocephalus, extra-axial collection or mass lesion/mass effect. Mild cerebral atrophy. Vascular: No hyperdense vessel or unexpected calcification. Skull: Normal. Negative for fracture or focal lesion. Sinuses/Orbits: No acute finding. Other: None. CT CERVICAL SPINE FINDINGS Alignment: Slight anterior subluxations demonstrated at C4-5 and C7-T1. These changes are nonspecific but most likely degenerative. Normal alignment of the posterior elements. C1-2 articulation appears intact. Skull base and vertebrae: Skull base appears intact. No vertebral compression deformities. No focal bone lesion or bone destruction. Soft tissues and spinal canal: No prevertebral soft tissue swelling. No abnormal paraspinal soft tissue mass or infiltration. Disc levels: Degenerative changes throughout with disc space narrowing and endplate osteophyte formation. Degenerative changes are most prominent at C5-6 and C6-7 levels. Prominent degenerative changes are noted in the upper thoracic spine with bridging anterior osteophytes. Degenerative changes throughout the facet joints. Degenerative changes in the temporomandibular joints. Upper chest: Patchy infiltrates are suggested in the right lung, possibly indicating pneumonia. Aspiration could be a secondary consideration in the appropriate clinical setting. Sternotomy wires. Aortic calcification. Other: None. IMPRESSION: 1. Degenerative changes throughout the cervical spine likely account for anterior subluxations at C4-5 and C7-T1. No acute displaced fractures are identified. 2. Patchy infiltrates in the right lung may indicate pneumonia. 3. No acute intracranial abnormalities.  Mild cerebral atrophy. Electronically Signed   By: Burman Nieves M.D.   On: 12/12/2022 20:01   CT Cervical Spine Wo Contrast  Result Date: 12/12/2022 CLINICAL DATA:  Syncope or presyncope  with cerebrovascular cause suspected. Loss of appetite and weakness. Recent steroid shot for neck pain. EXAM: CT HEAD WITHOUT CONTRAST CT CERVICAL SPINE WITHOUT CONTRAST TECHNIQUE: Multidetector CT imaging of the head and cervical spine was performed following the standard protocol without intravenous contrast. Multiplanar CT image reconstructions of the cervical spine were also generated. RADIATION DOSE REDUCTION: This exam was performed according to the departmental dose-optimization program which includes automated exposure control, adjustment of the mA and/or kV according to patient size and/or use of iterative reconstruction technique. COMPARISON:  None Available. FINDINGS: CT HEAD FINDINGS Brain: No evidence of acute infarction, hemorrhage, hydrocephalus, extra-axial collection or mass lesion/mass effect. Mild cerebral atrophy. Vascular: No hyperdense vessel or unexpected calcification. Skull: Normal. Negative for fracture or focal lesion. Sinuses/Orbits: No acute finding. Other: None. CT CERVICAL SPINE FINDINGS Alignment: Slight anterior subluxations demonstrated at C4-5 and C7-T1. These changes are nonspecific but most likely degenerative. Normal alignment of the posterior elements. C1-2 articulation appears intact.  Skull base and vertebrae: Skull base appears intact. No vertebral compression deformities. No focal bone lesion or bone destruction. Soft tissues and spinal canal: No prevertebral soft tissue swelling. No abnormal paraspinal soft tissue mass or infiltration. Disc levels: Degenerative changes throughout with disc space narrowing and endplate osteophyte formation. Degenerative changes are most prominent at C5-6 and C6-7 levels. Prominent degenerative changes are noted in the upper thoracic spine with bridging anterior osteophytes. Degenerative changes throughout the facet joints. Degenerative changes in the temporomandibular joints. Upper chest: Patchy infiltrates are suggested in the right lung,  possibly indicating pneumonia. Aspiration could be a secondary consideration in the appropriate clinical setting. Sternotomy wires. Aortic calcification. Other: None. IMPRESSION: 1. Degenerative changes throughout the cervical spine likely account for anterior subluxations at C4-5 and C7-T1. No acute displaced fractures are identified. 2. Patchy infiltrates in the right lung may indicate pneumonia. 3. No acute intracranial abnormalities.  Mild cerebral atrophy. Electronically Signed   By: Burman Nieves M.D.   On: 12/12/2022 20:01    Pending Labs Unresulted Labs (From admission, onward)     Start     Ordered   12/13/22 0137  Culture, OB Urine  Once,   R        12/13/22 0136            Vitals/Pain Today's Vitals   12/13/22 0715 12/13/22 0747 12/13/22 1030 12/13/22 1036  BP: (!) 120/52  120/77   Pulse: 67  70   Resp: 18  14   Temp:    98.3 F (36.8 C)  TempSrc:    Oral  SpO2: 94%  100%   Weight:      Height:      PainSc:  0-No pain      Isolation Precautions No active isolations  Medications Medications  aspirin EC tablet 81 mg (has no administration in time range)  atorvastatin (LIPITOR) tablet 80 mg (has no administration in time range)  ezetimibe (ZETIA) tablet 10 mg (has no administration in time range)  heparin injection 5,000 Units (has no administration in time range)  acetaminophen (TYLENOL) tablet 650 mg (has no administration in time range)    Or  acetaminophen (TYLENOL) suppository 650 mg (has no administration in time range)  cefTRIAXone (ROCEPHIN) 1 g in sodium chloride 0.9 % 100 mL IVPB (0 g Intravenous Stopped 12/13/22 0305)  lactated ringers infusion (has no administration in time range)  lactated ringers bolus 1,000 mL (0 mLs Intravenous Stopped 12/13/22 0125)  lactated ringers bolus 500 mL (0 mLs Intravenous Stopped 12/13/22 0305)    Mobility walks     Focused Assessments    R Recommendations: See Admitting Provider Note  Report given to:    Additional Notes:

## 2022-12-13 NOTE — ED Notes (Signed)
Report given to [employer] in yellow

## 2022-12-14 ENCOUNTER — Inpatient Hospital Stay (HOSPITAL_COMMUNITY): Payer: Medicare HMO

## 2022-12-14 DIAGNOSIS — E860 Dehydration: Secondary | ICD-10-CM

## 2022-12-14 DIAGNOSIS — N179 Acute kidney failure, unspecified: Secondary | ICD-10-CM | POA: Diagnosis not present

## 2022-12-14 DIAGNOSIS — I10 Essential (primary) hypertension: Secondary | ICD-10-CM | POA: Diagnosis not present

## 2022-12-14 DIAGNOSIS — R55 Syncope and collapse: Secondary | ICD-10-CM | POA: Diagnosis not present

## 2022-12-14 LAB — CULTURE, OB URINE: Culture: >=100000 — AB

## 2022-12-14 LAB — COMPREHENSIVE METABOLIC PANEL
ALT: 22 U/L (ref 0–44)
AST: 26 U/L (ref 15–41)
Albumin: 2.7 g/dL — ABNORMAL LOW (ref 3.5–5.0)
Alkaline Phosphatase: 54 U/L (ref 38–126)
Anion gap: 9 (ref 5–15)
BUN: 42 mg/dL — ABNORMAL HIGH (ref 8–23)
CO2: 23 mmol/L (ref 22–32)
Calcium: 8.4 mg/dL — ABNORMAL LOW (ref 8.9–10.3)
Chloride: 104 mmol/L (ref 98–111)
Creatinine, Ser: 2 mg/dL — ABNORMAL HIGH (ref 0.44–1.00)
GFR, Estimated: 24 mL/min — ABNORMAL LOW (ref >60)
Glucose, Bld: 126 mg/dL — ABNORMAL HIGH (ref 70–99)
Potassium: 4.1 mmol/L (ref 3.5–5.1)
Sodium: 136 mmol/L (ref 135–145)
Total Bilirubin: 1.1 mg/dL (ref 0.3–1.2)
Total Protein: 6.7 g/dL (ref 6.5–8.1)

## 2022-12-14 LAB — VITAMIN B12: Vitamin B-12: 457 pg/mL (ref 180–914)

## 2022-12-14 LAB — CORTISOL: Cortisol, Plasma: 2 ug/dL

## 2022-12-14 LAB — MAGNESIUM: Magnesium: 1.8 mg/dL (ref 1.7–2.4)

## 2022-12-14 LAB — TSH: TSH: 1.872 u[IU]/mL (ref 0.350–4.500)

## 2022-12-14 LAB — FOLATE: Folate: 10.4 ng/mL (ref >5.9)

## 2022-12-14 MED ORDER — MELATONIN 3 MG PO TABS
3.0000 mg | ORAL_TABLET | Freq: Every day | ORAL | Status: DC
  Administered 2022-12-14: 3 mg via ORAL
  Filled 2022-12-14: qty 1

## 2022-12-14 MED ORDER — CARVEDILOL 6.25 MG PO TABS
6.2500 mg | ORAL_TABLET | Freq: Two times a day (BID) | ORAL | Status: DC
  Administered 2022-12-15: 6.25 mg via ORAL
  Filled 2022-12-14 (×2): qty 1

## 2022-12-14 MED ORDER — SODIUM CHLORIDE 0.9 % IV BOLUS
500.0000 mL | Freq: Once | INTRAVENOUS | Status: AC
  Administered 2022-12-14: 500 mL via INTRAVENOUS

## 2022-12-14 MED ORDER — LACTATED RINGERS IV SOLN: INTRAVENOUS | Status: DC

## 2022-12-14 NOTE — Evaluation (Signed)
Occupational Therapy Evaluation Patient Details Name: Sophia Schroeder MRN: [id number] DOB: Nov 17, 1940 Today's Date: 12/14/2022   History of Present Illness Kynnadi Dicenso is a 82 y.o. female who presents with syncopal episode. CT head clear. 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 PMHx: CAD with CABG X 4 , HTN, HLD.   Clinical Impression   Pt evaluated s/p above admission list. Pt reports independence with ADL/IADLs, driving and functional mobility without use of AD at baseline. Pt presents this session with generalized weakness, decreased balance and asymptomatic OH with vitals listed below. Pt disoriented to time and situation with son stating cognition is at baseline. Pt required max cues for redirection throughout and follows 1 step commands with increased time and cues. Pt currently requires setup A for seated UB ADLs and min guard A for LB ADLs. Pt completed STS transfer from EOB and marching in place without AD with min guard A. Further mobility deferred this session secondary to [facility], RN and MD made aware. Pt would benefit from continued acute OT services to maximize functional independence and facilitate transition to pt's natural home environment with [facility] OT services upon discharge.   Session vitals:  BP supine: 145/65, BP EOB: 126/64, BP 2 minutes EOB: 120/70, BP standing: 103/54, BP 2 minutes standing: 96/53, BP return to supine: 141/61     Recommendations for follow up therapy are one component of a multi-disciplinary discharge planning process, led by the attending physician.  Recommendations may be updated based on patient status, additional functional criteria and insurance authorization.   Assistance Recommended at Discharge Intermittent Supervision/Assistance  Patient can return home with the following A little help with walking and/or transfers;A little help with bathing/dressing/bathroom;Assistance with cooking/housework;Direct supervision/assist for medications management;Direct  supervision/assist for financial management;Assist for transportation;Help with stairs or ramp for entrance    Functional Status Assessment  Patient has had a recent decline in their functional status and demonstrates the ability to make significant improvements in function in a reasonable and predictable amount of time.  Equipment Recommendations  Tub/shower seat    Recommendations for Other Services       Precautions / Restrictions Precautions Precautions: Fall Precaution Comments: watch BP Restrictions Weight Bearing Restrictions: No      Mobility Bed Mobility Overal bed mobility: Needs Assistance Bed Mobility: Supine to Sit, Sit to Supine     Supine to sit: HOB elevated, Min assist Sit to supine: Min guard   General bed mobility comments: assist at trunk    Transfers Overall transfer level: Needs assistance Equipment used: None Transfers: Sit to/from Stand Sit to Stand: Min guard           General transfer comment: STS transfer from EOB with no AD and min guard A. Able to march in place with min guard A. Further mobility deferred secondary to [facility].      Balance Overall balance assessment: Mild deficits observed, not formally tested                                         ADL either performed or assessed with clinical judgement   ADL Overall ADL's : Needs assistance/impaired Eating/Feeding: Independent;Sitting   Grooming: Set up;Sitting   Upper Body Bathing: Set up;Sitting   Lower Body Bathing: Min guard;Sit to/from stand   Upper Body Dressing : Set up;Sitting   Lower Body Dressing: Min guard;Sit to/from stand   Toilet  Transfer: Solicitor;Ambulation Toilet Transfer Details (indicate cue type and reason): simulated Toileting- Clothing Manipulation and Hygiene: Min guard;Sit to/from stand       Functional mobility during ADLs: Min guard General ADL Comments: limited secondary to generalized weakness, neck pain and  asymptomatic OH     Vision Baseline Vision/History: 0 No visual deficits Ability to See in Adequate Light: 0 Adequate Vision Assessment?: No apparent visual deficits     Perception Perception Perception Tested?: No   Praxis Praxis Praxis tested?: Not tested    Pertinent Vitals/Pain Pain Assessment Pain Assessment: Faces Faces Pain Scale: Hurts little more Pain Location: neck Pain Descriptors / Indicators: Discomfort, Constant Pain Intervention(s): Monitored during session, Limited activity within patient's tolerance     Hand Dominance Right   Extremity/Trunk Assessment Upper Extremity Assessment Upper Extremity Assessment: Generalized weakness   Lower Extremity Assessment Lower Extremity Assessment: Defer to PT evaluation   Cervical / Trunk Assessment Cervical / Trunk Assessment: Kyphotic   Communication Communication Communication: No difficulties (verbose)   Cognition Arousal/Alertness: Awake/alert Behavior During Therapy: WFL for tasks assessed/performed Overall Cognitive Status: Impaired/Different from baseline Area of Impairment: Orientation, Attention, Memory, Following commands, Awareness, Problem solving, Safety/judgement                 Orientation Level: Disoriented to, Time, Situation Current Attention Level: Focused, Sustained Memory: Decreased recall of precautions Following Commands: Follows one step commands with increased time Safety/Judgement: Decreased awareness of safety, Decreased awareness of deficits Awareness: Intellectual Problem Solving: Requires verbal cues, Slow processing General Comments: Pt's son reports it is normal for the pt to be disoriented to month and year and that she is at her cognitive baseline. Pt self-distracting throughout requiring max cues for redirection. Benefits from 1 step commands with increased time. Limited awareness of current deficits in relation to safety. Some inappropriate responses to questions noted.      General Comments  BP supine: 145/65, BP EOB: 126/64, BP 2 minutes EOB: 120/70, BP standing: 103/54, BP 2 minutes standing: 96/53, BP return to supine: 141/61, RN and MD aware. Family present at bedside.    Exercises     Shoulder Instructions      Home Living Family/patient expects to be discharged to:: Private residence (Retirement housing) Living Arrangements: Alone Available Help at Discharge: Available PRN/intermittently;Family;Friend(s);Neighbor Type of Home: Apartment Home Access: Level entry     Home Layout: One level     Bathroom Shower/Tub: Producer, television/film/video: Handicapped height Bathroom Accessibility: Yes How Accessible: Accessible via wheelchair;Accessible via walker Home Equipment: None   Additional Comments: son and dtr live within 10 minutes, pt has neighbors who can assist as well      Prior Functioning/Environment Prior Level of Function : Independent/Modified Independent;Driving;History of Falls (last six months)             Mobility Comments: no AD, 1 recent fall. pt reports her body "just gave out" ADLs Comments: indep including med mgmt and driving        OT Problem List: Decreased strength;Decreased activity tolerance;Impaired balance (sitting and/or standing);Decreased cognition;Decreased safety awareness;Decreased knowledge of precautions;Pain      OT Treatment/Interventions: Self-care/ADL training;Therapeutic exercise;Energy conservation;DME and/or AE instruction;Therapeutic activities;Cognitive remediation/compensation;Patient/family education;Balance training    OT Goals(Current goals can be found in the care plan section) Acute Rehab OT Goals Patient Stated Goal: to get better OT Goal Formulation: With patient Time For Goal Achievement: 12/28/22 Potential to Achieve Goals: Good ADL Goals Pt Will Perform Grooming: standing;with  modified independence Pt Will Perform Lower Body Dressing: with modified independence;sit  to/from stand Pt Will Transfer to Toilet: with modified independence;ambulating;regular height toilet Additional ADL Goal #1: Pt will complete 2 step ADL task with min cues  OT Frequency: Min 2X/week    Co-evaluation              AM-PAC OT "6 Clicks" Daily Activity     Outcome Measure Help from another person eating meals?: None Help from another person taking care of personal grooming?: A Little Help from another person toileting, which includes using toliet, bedpan, or urinal?: A Little Help from another person bathing (including washing, rinsing, drying)?: A Little Help from another person to put on and taking off regular upper body clothing?: A Little Help from another person to put on and taking off regular lower body clothing?: A Little 6 Click Score: 19   End of Session Equipment Utilized During Treatment: Other (comment) (none) Nurse Communication: Mobility status  Activity Tolerance: Other (comment) (OH) Patient left: in bed;with call bell/phone within reach;with family/visitor present  OT Visit Diagnosis: Unsteadiness on feet (R26.81);Muscle weakness (generalized) (M62.81);History of falling (Z91.81);Dizziness and giddiness (R42)                Time: 6237-6283 OT Time Calculation (min): 32 min Charges:  OT General Charges $OT Visit: 1 Visit OT Evaluation $OT Eval Moderate Complexity: 1 Mod OT Treatments $Therapeutic Activity: 8-22 mins  Sherley Bounds, OTS Acute Rehabilitation Services Office [phone] Secure Chat Communication Preferred   Sherley Bounds 12/14/2022, 11:37 AM

## 2022-12-14 NOTE — TOC Initial Note (Signed)
Transition of Care [facility]) - Initial/Assessment Note    Patient Details  Name: Sophia Schroeder MRN: [id number] Date of Birth: 04-25-1941  Transition of [facility] CM/SW Contact:    Janae Bridgeman, RN Phone Number: 12/14/2022, 3:03 PM  Clinical Narrative:                 CM met with the patient at the bedside to discuss TOC needs.  The patient lives at home alone and had syncopal episode at the home and fell on a carpeted floor.  The patient states that her Heart clinic recently changed her Coreg dose and she states that she has not had a good appetite recently.  Son and daughter are present at the bedside.  The patient was evaluated by OT and home health services were recommended.  PT is still pending at this time.  Medicare choice regarding home health services was offered and the patient did not have a preference.  [facility] was called and they accepted the patient for home health RN, PT, OT.   HH orders were placed to be co-signed by the MD.  I requested that family call [employer] and request discharge meals through the insurance company if she has benefits along with requesting a LIfe alert through [facility] as well.  Patient has Cane at this time.  CM will continue to follow the patient for discharge needs - pending medical stability to return to home - later date.  Patient's family will be providing transportation to home.  Expected Discharge Plan: Home w Home Health Services Barriers to Discharge: Continued Medical Work up   Patient Goals and CMS Choice Patient states their goals for this hospitalization and ongoing recovery are:: To get better and return home CMS Medicare.gov Compare Post Acute Care list provided to:: Patient Choice offered to / list presented to : Patient [place] ownership interest in [facility]provided to:: Patient    Expected Discharge Plan and Services   Discharge Planning Services: CM Consult Post Acute Care Choice: Home Health Living  arrangements for the past 2 months: Apartment                           HH Arranged: RN, PT, OT HH Agency: [facility] Date [facility] Agency Contacted: 12/14/22 Time HH Agency Contacted: 1500 Representative spoke with at [facility] Agency: Kandee Keen, RNCM with [facility] accepted for RN, PT, OT  Prior Living Arrangements/Services Living arrangements for the past 2 months: Apartment Lives with:: Self Patient language and need for interpreter reviewed:: Yes Do you feel safe going back to the place where you live?: Yes      Need for Family Participation in Patient Care: Yes (Comment) Care giver support system in place?: Yes (comment) Current home services: [facility]) Criminal Activity/Legal Involvement Pertinent to Current Situation/Hospitalization: No - Comment as needed  Activities of Daily Living      Permission Sought/Granted Permission sought to share information with : Case Manager, Oceanographer granted to share information with : Yes, Verbal Permission Granted     Permission granted to share info w AGENCY: [facility] accepted for PT, OT, RN        Emotional Assessment Appearance:: Appears stated age Attitude/Demeanor/Rapport: Gracious Affect (typically observed): Accepting Orientation: : Oriented to Self, Oriented to Place, Oriented to  Time, Oriented to Situation Alcohol / Substance Use: Not Applicable Psych Involvement: No (comment)  Admission diagnosis:  Syncope and collapse [R55]  Dehydration [E86.0] Patient Active Problem List   Diagnosis Date Noted   AKI (acute kidney injury) (HCC) 12/13/2022   Syncope and collapse 12/12/2022   Stage 3b chronic kidney disease (HCC) - baseline SCr 1.4 06/21/2017   PVD 08/24/2009   Hyperlipidemia 08/20/2008   HYPERTENSION, BENIGN 08/20/2008   CAD, AUTOLOGOUS BYPASS GRAFT 08/20/2008   CAROTID ARTERY STENOSIS, 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 08/20/2008   PCP:  Verlon Au, MD Pharmacy:   [facility]  [phone] [address]ura Spice, [place] - [address] AT [facility] [address] [place] [address] Phone: [phone] Fax: [phone]     Social Determinants of Health (SDOH) Social History: SDOH Screenings   Tobacco Use: Low Risk  (12/12/2022)   Received from [facility]   SDOH Interventions:     Readmission Risk Interventions    12/14/2022    3:01 PM  Readmission Risk Prevention Plan  Transportation Screening Complete  PCP or Specialist Appt within 5-7 Days Complete  Home Care Screening Complete  Medication Review (RN CM) Complete

## 2022-12-14 NOTE — Progress Notes (Signed)
[facility]                                                                              [name], is a 82 y.o. female, DOB - [birth year], GNF:[id number] Admit date - 12/12/2022    Outpatient Primary MD for the patient is Verlon Au, MD  LOS - 2  days  Chief Complaint  Patient presents with   Altered Mental Status       Brief summary   Patient is a  82 y.o. female with CAD with CABG X 4 , HTN, HLD presents with syncopal episode. She went to see her PCP on the day of admission and was a given a steroid shot for neck pain . After she went home, she had a syncopal episode which was witnessed by her son. He found her slumped in a chair with her eyes open, mouth moving but no words coming out so he called 911. This episode lasted 30 seconds. No LOC. Of note pt also has been feeling dizziness for the last few days, has been off her food and had a fall at home. Denied seizures, post ictal state, nausea, vomiting, headache, vision changes, speech changes, unilateral limb weakness, paraesthesia/numbness, ataxia or urinary/bowel incontinence.   CXR: Mild cardiomegaly CT head: No evidence of acute infarction  CT c-spine: Degenerative changes throughout the cervical spine likely account for anterior subluxations at C4-5 and C7-T1. No acute displaced fractures are identified. Patchy infiltrates in the right lung may indicate pneumonia. No acute intracranial abnormalities.  Mild cerebral atrophy.    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  UA negative for proteinuria -Will need outpatient nephrology follow-up -Continue IV fluids   Orthostatic with history of HYPERTENSION, BENIGN -decrease Coreg to 6.25 mg twice daily, hold ARB, HCTZ as above -Already received amlodipine this morning     CAD, AUTOLOGOUS BYPASS GRAFT - Currently no chest pain or acute shortness of breath - continue ASA, BB, zetia    Estimated body mass index is 25.9 kg/m as calculated from the following:   Height as of this encounter: 5\' 5"  (1.651 m).   Weight as of this encounter: 70.6 kg.  Code Status: full  DVT Prophylaxis:  heparin injection 5,000 Units Start: 12/13/22 1400 SCDs Start: 12/13/22 0005   Level of Care: Level of care: Telemetry Medical Family Communication: Updated patient.  Called patient's daughter Babette Relic in [place] however was unable to reach her. Disposition Plan:      Remains inpatient appropriate:   DC home in a.m. if improving.   Procedures:  Echo   Consultants:  Antimicrobials:   Anti-infectives (From admission, onward)    Start     Dose/Rate Route Frequency Ordered Stop   12/13/22 0145  cefTRIAXone (ROCEPHIN) 1 g in sodium chloride 0.9 % 100 mL IVPB  Status:  Discontinued        1 g 200 mL/hr over 30 Minutes Intravenous Daily at bedtime 12/13/22 0135 12/13/22 1144          Medications  aspirin EC  81 mg Oral Daily   atorvastatin  80 mg Oral Daily   carvedilol  6.25 mg Oral BID WC   ezetimibe  10 mg Oral Daily   heparin  5,000 Units Subcutaneous Q8H   melatonin  3 mg Oral QHS   sertraline  25 mg Oral Daily      Subjective:   [name] was seen and examined today.  + Orthostatic with PT evaluation.  No chest pain, shortness of breath, nausea vomiting or any diarrhea.  Objective:   Vitals:   12/13/22 1635 12/13/22 1953 12/14/22 0421 12/14/22 0747  BP: (!) 137/57 (!) 112/51 (!) 122/50 134/60  Pulse: 65 (!) 55 (!) 57 61  Resp:  18 18  18   Temp: 97.6 F (36.4 C) 97.9 F (36.6 C) 97.6 F (36.4 C) (!) 97.3 F (36.3 C)  TempSrc: Oral   Oral  SpO2: 96% 95% 95% 100%  Weight:      Height:        Intake/Output Summary (Last 24 hours) at 12/14/2022 1244 Last data filed at 12/14/2022 0406 Gross per 24 hour  Intake 1706.67 ml  Output --  Net 1706.67 ml     Wt Readings from Last 3 Encounters:  12/12/22 70.6 kg  11/29/22 70.6 kg  08/27/20 73.7 kg     Exam General: Alert and oriented x 3, NAD Cardiovascular: S1 S2 auscultated,  RRR Respiratory: Clear to auscultation bilaterally, no wheezing Gastrointestinal: Soft, nontender, nondistended, + bowel sounds Ext: no pedal edema bilaterally Neuro: no new FND's Psych: Normal affect     Data Reviewed:  I have personally reviewed following labs    CBC Lab Results  Component Value Date   WBC 5.0 12/13/2022   RBC 3.33 (L) 12/13/2022   HGB 10.5 (L) 12/13/2022   HCT 30.3 (L) 12/13/2022   MCV 91.0 12/13/2022   MCH 31.5 12/13/2022   PLT 200 12/13/2022   MCHC 34.7 12/13/2022   RDW 12.6 12/13/2022     Last metabolic panel Lab Results  Component Value Date   NA 136 12/14/2022   K 4.1 12/14/2022   CL 104 12/14/2022   CO2 23 12/14/2022   BUN 42 (H) 12/14/2022   CREATININE 2.00 (H) 12/14/2022   GLUCOSE 126 (H) 12/14/2022   GFRNONAA 24 (L) 12/14/2022   GFRAA 71 10/03/2007   CALCIUM 8.4 (L) 12/14/2022   PROT 6.7 12/14/2022   ALBUMIN 2.7 (L) 12/14/2022   LABGLOB 3.3 09/02/2020   AGRATIO 1.2 09/02/2020   BILITOT 1.1 12/14/2022   ALKPHOS 54 12/14/2022   AST 26 12/14/2022   ALT 22 12/14/2022   ANIONGAP 9 12/14/2022    CBG (last 3)  No results for input(s): "GLUCAP" in the last 72 hours.    Coagulation Profile: No results for input(s): "INR", "PROTIME" in the last 168 hours.   Radiology Studies: I have personally reviewed the imaging studies  ECHOCARDIOGRAM COMPLETE  Result Date: 12/13/2022    ECHOCARDIOGRAM REPORT   Patient Name:   Sophia Schroeder  Date of Exam: 12/13/2022 Medical Rec #:  [id number]     Height:       65.0 in Accession #:    [id number]    Weight:       155.6 lb Date of Birth:  26-Aug-1940    BSA:          1.778 m Patient Age:    81 years      BP:           120/77 mmHg Patient Gender: F             HR:           66 bpm. Exam Location:  Inpatient Procedure: 2D Echo, Cardiac Doppler and Color Doppler Indications:    Syncope R55  History:        Patient has no prior history of Echocardiogram examinations.                 CAD, Prior Cardiac Surgery, Carotid Disease and PAD; Risk                 Factors:Non-Smoker and Dyslipidemia.  Sonographer:    Dondra Prader RVT RCS Referring Phys: Wayne Both, J  Sonographer Comments: Technically difficult study due to poor echo windows, suboptimal apical window and suboptimal parasternal window. Declined definity IMPRESSIONS  1. Left ventricular ejection fraction, by estimation, is 65 to 70%. The left ventricle has normal function. The left ventricle has no regional wall motion abnormalities. There is moderate concentric left ventricular hypertrophy. Left ventricular diastolic parameters are consistent with Grade I diastolic dysfunction (impaired relaxation).  2. Right ventricular systolic function is normal. The right ventricular size is normal.  3. Left atrial size was mildly dilated.  4. The mitral valve is normal in structure. No evidence of mitral valve regurgitation. No evidence of mitral stenosis.  5. The aortic valve was not well visualized. There is mild calcification of the aortic valve. Aortic valve regurgitation is not visualized. Aortic valve sclerosis/calcification is present, without any evidence of aortic stenosis.  6. The inferior vena cava is dilated in size with >50% respiratory variability, suggesting right atrial pressure of 8 mmHg. FINDINGS  Left Ventricle: Left ventricular ejection fraction, by estimation, is 65 to 70%. The left ventricle has normal function. The left ventricle has no  regional wall motion abnormalities. The left ventricular internal cavity size was normal in size. There is  moderate concentric left ventricular hypertrophy. Left ventricular diastolic parameters are consistent with Grade I diastolic dysfunction (impaired relaxation). Right Ventricle: The right ventricular size is normal. No increase in right ventricular wall thickness. Right ventricular systolic function is normal. Left Atrium: Left atrial size was mildly dilated. Right Atrium: Right atrial size was normal in size. Pericardium: There is no evidence of pericardial effusion. Mitral Valve: The mitral valve is normal in structure. No evidence of mitral valve regurgitation. No evidence of mitral valve stenosis. Tricuspid Valve: The tricuspid valve is normal in structure. Tricuspid valve regurgitation is trivial. No evidence of tricuspid stenosis. Aortic Valve: The aortic valve was not well visualized. There is mild calcification of the aortic valve. Aortic valve regurgitation is not visualized. Aortic valve sclerosis/calcification is present, without any evidence of aortic stenosis. Aortic valve mean gradient measures 5.5 mmHg. Aortic valve peak gradient measures 9.6 mmHg. Aortic valve area, by VTI measures 2.63 cm. Pulmonic Valve: The pulmonic valve was not well visualized. Pulmonic valve regurgitation is not visualized. No evidence of pulmonic stenosis. Aorta: The aortic root is normal in size and structure. Venous: The inferior vena  cava is dilated in size with greater than 50% respiratory variability, suggesting right atrial pressure of 8 mmHg. IAS/Shunts: No atrial level shunt detected by color flow Doppler.  LEFT VENTRICLE PLAX 2D LVIDd:         5.20 cm   Diastology LVIDs:         2.90 cm   LV e' medial:    5.87 cm/s LV PW:         0.90 cm   LV E/e' medial:  12.3 LV IVS:        1.10 cm   LV e' lateral:   9.25 cm/s LVOT diam:     2.20 cm   LV E/e' lateral: 7.8 LV SV:         97 LV SV Index:   55 LVOT Area:      3.80 cm  RIGHT VENTRICLE             IVC RV S prime:     12.30 cm/s  IVC diam: 2.10 cm TAPSE (M-mode): 1.6 cm LEFT ATRIUM             Index        RIGHT ATRIUM           Index LA diam:        3.90 cm 2.19 cm/m   RA Area:     14.30 cm LA Vol (A2C):   64.9 ml 36.50 ml/m  RA Volume:   34.90 ml  19.63 ml/m LA Vol (A4C):   51.2 ml 28.79 ml/m LA Biplane Vol: 64.5 ml 36.27 ml/m  AORTIC VALVE                     PULMONIC VALVE AV Area (Vmax):    2.67 cm      PV Vmax:       1.48 m/s AV Area (Vmean):   2.68 cm      PV Peak grad:  8.8 mmHg AV Area (VTI):     2.63 cm AV Vmax:           155.00 cm/s AV Vmean:          108.000 cm/s AV VTI:            0.368 m AV Peak Grad:      9.6 mmHg AV Mean Grad:      5.5 mmHg LVOT Vmax:         109.00 cm/s LVOT Vmean:        76.000 cm/s LVOT VTI:          0.255 m LVOT/AV VTI ratio: 0.69  AORTA Ao Root diam: 3.00 cm Ao Asc diam:  3.50 cm MITRAL VALVE MV Area (PHT): 3.77 cm    SHUNTS MV Decel Time: 201 msec    Systemic VTI:  0.26 m MV E velocity: 72.30 cm/s  Systemic Diam: 2.20 cm MV A velocity: 80.70 cm/s MV E/A ratio:  0.90 Arvilla Meres MD Electronically signed by Arvilla Meres MD Signature Date/Time: 12/13/2022/12:31:23 PM    Final    DG Chest Portable 1 View  Result Date: 12/12/2022 CLINICAL DATA:  Poor appetite weakness EXAM: PORTABLE CHEST 1 VIEW COMPARISON:  10/15/2001 FINDINGS: Post sternotomy changes. Mild cardiomegaly. No acute airspace disease or pleural effusion. Aortic atherosclerosis. IMPRESSION: Mild cardiomegaly. Electronically Signed   By: Jasmine Pang M.D.   On: 12/12/2022 21:38   CT HEAD WO CONTRAST  Result Date: 12/12/2022 CLINICAL DATA:  Syncope or presyncope with  cerebrovascular cause suspected. Loss of appetite and weakness. Recent steroid shot for neck pain. EXAM: CT HEAD WITHOUT CONTRAST CT CERVICAL SPINE WITHOUT CONTRAST TECHNIQUE: Multidetector CT imaging of the head and cervical spine was performed following the standard protocol without  intravenous contrast. Multiplanar CT image reconstructions of the cervical spine were also generated. RADIATION DOSE REDUCTION: This exam was performed according to the departmental dose-optimization program which includes automated exposure control, adjustment of the mA and/or kV according to patient size and/or use of iterative reconstruction technique. COMPARISON:  None Available. FINDINGS: CT HEAD FINDINGS Brain: No evidence of acute infarction, hemorrhage, hydrocephalus, extra-axial collection or mass lesion/mass effect. Mild cerebral atrophy. Vascular: No hyperdense vessel or unexpected calcification. Skull: Normal. Negative for fracture or focal lesion. Sinuses/Orbits: No acute finding. Other: None. CT CERVICAL SPINE FINDINGS Alignment: Slight anterior subluxations demonstrated at C4-5 and C7-T1. These changes are nonspecific but most likely degenerative. Normal alignment of the posterior elements. C1-2 articulation appears intact. Skull base and vertebrae: Skull base appears intact. No vertebral compression deformities. No focal bone lesion or bone destruction. Soft tissues and spinal canal: No prevertebral soft tissue swelling. No abnormal paraspinal soft tissue mass or infiltration. Disc levels: Degenerative changes throughout with disc space narrowing and endplate osteophyte formation. Degenerative changes are most prominent at C5-6 and C6-7 levels. Prominent degenerative changes are noted in the upper thoracic spine with bridging anterior osteophytes. Degenerative changes throughout the facet joints. Degenerative changes in the temporomandibular joints. Upper chest: Patchy infiltrates are suggested in the right lung, possibly indicating pneumonia. Aspiration could be a secondary consideration in the appropriate clinical setting. Sternotomy wires. Aortic calcification. Other: None. IMPRESSION: 1. Degenerative changes throughout the cervical spine likely account for anterior subluxations at C4-5 and C7-T1.  No acute displaced fractures are identified. 2. Patchy infiltrates in the right lung may indicate pneumonia. 3. No acute intracranial abnormalities.  Mild cerebral atrophy. Electronically Signed   By: Burman Nieves M.D.   On: 12/12/2022 20:01   CT Cervical Spine Wo Contrast  Result Date: 12/12/2022 CLINICAL DATA:  Syncope or presyncope with cerebrovascular cause suspected. Loss of appetite and weakness. Recent steroid shot for neck pain. EXAM: CT HEAD WITHOUT CONTRAST CT CERVICAL SPINE WITHOUT CONTRAST TECHNIQUE: Multidetector CT imaging of the head and cervical spine was performed following the standard protocol without intravenous contrast. Multiplanar CT image reconstructions of the cervical spine were also generated. RADIATION DOSE REDUCTION: This exam was performed according to the departmental dose-optimization program which includes automated exposure control, adjustment of the mA and/or kV according to patient size and/or use of iterative reconstruction technique. COMPARISON:  None Available. FINDINGS: CT HEAD FINDINGS Brain: No evidence of acute infarction, hemorrhage, hydrocephalus, extra-axial collection or mass lesion/mass effect. Mild cerebral atrophy. Vascular: No hyperdense vessel or unexpected calcification. Skull: Normal. Negative for fracture or focal lesion. Sinuses/Orbits: No acute finding. Other: None. CT CERVICAL SPINE FINDINGS Alignment: Slight anterior subluxations demonstrated at C4-5 and C7-T1. These changes are nonspecific but most likely degenerative. Normal alignment of the posterior elements. C1-2 articulation appears intact. Skull base and vertebrae: Skull base appears intact. No vertebral compression deformities. No focal bone lesion or bone destruction. Soft tissues and spinal canal: No prevertebral soft tissue swelling. No abnormal paraspinal soft tissue mass or infiltration. Disc levels: Degenerative changes throughout with disc space narrowing and endplate osteophyte  formation. Degenerative changes are most prominent at C5-6 and C6-7 levels. Prominent degenerative changes are noted in the upper thoracic spine with bridging anterior osteophytes. Degenerative changes throughout  the facet joints. Degenerative changes in the temporomandibular joints. Upper chest: Patchy infiltrates are suggested in the right lung, possibly indicating pneumonia. Aspiration could be a secondary consideration in the appropriate clinical setting. Sternotomy wires. Aortic calcification. Other: None. IMPRESSION: 1. Degenerative changes throughout the cervical spine likely account for anterior subluxations at C4-5 and C7-T1. No acute displaced fractures are identified. 2. Patchy infiltrates in the right lung may indicate pneumonia. 3. No acute intracranial abnormalities.  Mild cerebral atrophy. Electronically Signed   By: Burman Nieves M.D.   On: 12/12/2022 20:01       Shron Ozer M.D. [facility] 12/14/2022, 12:44 PM  Available via Epic secure chat 7am-7pm After 7 pm, please refer to night coverage provider listed on amion.

## 2022-12-14 NOTE — Evaluation (Signed)
Physical Therapy Evaluation Patient Details Name: Sophia Schroeder MRN: [id number] DOB: [birth year] Today's Date: 12/14/2022  History of Present Illness  Sophia Schroeder is a 82 y.o. female who presents with syncopal episode on 12/12/22. CT head clear. Noted to have AKI on CKD stage 3b  Orthostatic BP has been positive. Marland Kitchen PMHx: CAD with CABG X 4 , HTN, HLD.  Clinical Impression  Pt admitted with above diagnosis.  She is typically independent and ambulatory without AD.  Pt does not have hx of falls other than one associated with this syncopal admission.  Pt pleasant and motivated.  Her son and daughter present during session.  Pt requiring supervision for transfers and to ambulate 200'.  She demonstrated good strength and balance.  She does still have orthostatic hypotension but improving gradually and was completely asymptomatic and able to tolerate activity.  Educated on safety with orthostatic hypotension (see comments).  No further skilled PT indicated.         Assistance Recommended at Discharge PRN  If plan is discharge home, recommend the following:  Can travel by private vehicle  Assistance with cooking/housework;Direct supervision/assist for medications management;Help with stairs or ramp for entrance        Equipment Recommendations None recommended by PT  Recommendations for Other Services       Functional Status Assessment Patient has not had a recent decline in their functional status     Precautions / Restrictions Precautions Precautions: Fall Precaution Comments: watch BP      Mobility  Bed Mobility Overal bed mobility: Needs Assistance Bed Mobility: Supine to Sit, Sit to Supine     Supine to sit: Modified independent (Device/Increase time) Sit to supine: Modified independent (Device/Increase time)        Transfers Overall transfer level: Needs assistance Equipment used: None Transfers: Sit to/from Stand Sit to Stand: Supervision           General  transfer comment: Close supervision due to orthostatic BP with admission    Ambulation/Gait Ambulation/Gait assistance: Min guard, Supervision Gait Distance (Feet): 200 Feet Assistive device: None Gait Pattern/deviations: WFL(Within Functional Limits) Gait velocity: normal     General Gait Details: Min guard progressing to supervision; normal gait; no LOB  Stairs            Wheelchair Mobility     Tilt Bed    Modified Rankin (Stroke Patients Only)       Balance Overall balance assessment: Needs assistance Sitting-balance support: No upper extremity supported Sitting balance-Leahy Scale: Good Sitting balance - Comments: Pt with kyphotic posture and when talking to therapist at EOB kept leaning back and holding on with hands.  Initially , thought pt losing balance posteriorly but she was intentionally leaning back to look at therapist.  When therapist squatted pt sat without leaning back or use of hands   Standing balance support: No upper extremity supported Standing balance-Leahy Scale: Good                               Pertinent Vitals/Pain Pain Assessment Pain Assessment: Faces Faces Pain Scale: Hurts a little bit Pain Location: neck Pain Descriptors / Indicators: Tightness Pain Intervention(s): Limited activity within patient's tolerance, Monitored during session    Home Living Family/patient expects to be discharged to:: Private residence Living Arrangements: Alone Available Help at Discharge: Available PRN/intermittently;Family;Friend(s);Neighbor Type of Home: Apartment Home Access: Level entry       Home Layout:  One level Home Equipment: None Additional Comments: son and dtr live within 10 minutes, pt has neighbors who can assist as well    Prior Function Prior Level of Function : Independent/Modified Independent;Driving;History of Falls (last six months)             Mobility Comments: could ambulate in community without AD; no  falls other than admitting fall from syncope ADLs Comments: indep including med mgmt and driving     Hand Dominance   Dominant Hand: Right    Extremity/Trunk Assessment   Upper Extremity Assessment Upper Extremity Assessment: Defer to OT evaluation    Lower Extremity Assessment Lower Extremity Assessment: RLE deficits/detail;LLE deficits/detail RLE Deficits / Details: ROM WFL; MMT 5/5 LLE Deficits / Details: ROM WFL; MMT 5/5    Cervical / Trunk Assessment Cervical / Trunk Assessment: Kyphotic  Communication   Communication: No difficulties  Cognition Arousal/Alertness: Awake/alert Behavior During Therapy: WFL for tasks assessed/performed Overall Cognitive Status: Within Functional Limits for tasks assessed                                 General Comments: Follows basic commands; per OT note baseline for pt to be disoriented to month and year; pt does need redirecting at times        General Comments General comments (skin integrity, edema, etc.): Educated pt and family on safety with orthostatic hypotension including staying hydrated, slow transfers with exercises in each position prior to progressing, close monitoring of symptoms and return to sitting/laying immediately if occurs.  Pt demonstrating LAQ, ankle pumps, and shoulder press AROM exercises.     Exercises  Orthostatic BPs  Supine 142/62 and HR 52  Sitting 117/59 and HR 55  Standing 102/61 and HR 63 asymptomatic  Standing 3 min 110/56 and HR 62  Post Walk Stand 112/49 and HR 62   Notified RN still orthostatic but asymptomatic and able to ambulate   Assessment/Plan    PT Assessment Patient does not need any further PT services  PT Problem List         PT Treatment Interventions      PT Goals (Current goals can be found in the Care Plan section)  Acute Rehab PT Goals Patient Stated Goal: return home PT Goal Formulation: All assessment and education complete, DC therapy    Frequency        Co-evaluation               AM-PAC PT "6 Clicks" Mobility  Outcome Measure Help needed turning from your back to your side while in a flat bed without using bedrails?: None Help needed moving from lying on your back to sitting on the side of a flat bed without using bedrails?: None Help needed moving to and from a bed to a chair (including a wheelchair)?: A Little Help needed standing up from a chair using your arms (e.g., wheelchair or bedside chair)?: A Little Help needed to walk in hospital room?: A Little Help needed climbing 3-5 steps with a railing? : A Little 6 Click Score: 20    End of Session Equipment Utilized During Treatment: Gait belt Activity Tolerance: Patient tolerated treatment well Patient left: in bed;with call bell/phone within reach;with family/visitor present (sat EOB) Nurse Communication: Mobility status PT Visit Diagnosis: Other abnormalities of gait and mobility (R26.89)    Time: 7829-5621 PT Time Calculation (min) (ACUTE ONLY): 25 min   Charges:  PT Evaluation $PT Eval Low Complexity: 1 Low PT Treatments $Therapeutic Exercise: 8-22 mins PT General Charges $$ ACUTE PT VISIT: 1 Visit         Anise Salvo, PT Acute Rehab [facility] [phone]   Rayetta Humphrey 12/14/2022, 5:10 PM

## 2022-12-15 DIAGNOSIS — N179 Acute kidney failure, unspecified: Secondary | ICD-10-CM | POA: Diagnosis not present

## 2022-12-15 DIAGNOSIS — I25710 Atherosclerosis of autologous vein coronary artery bypass graft(s) with unstable angina pectoris: Secondary | ICD-10-CM

## 2022-12-15 DIAGNOSIS — E860 Dehydration: Secondary | ICD-10-CM | POA: Diagnosis not present

## 2022-12-15 DIAGNOSIS — R55 Syncope and collapse: Secondary | ICD-10-CM | POA: Diagnosis not present

## 2022-12-15 LAB — BASIC METABOLIC PANEL
Anion gap: 7 (ref 5–15)
BUN: 32 mg/dL — ABNORMAL HIGH (ref 8–23)
CO2: 23 mmol/L (ref 22–32)
Calcium: 8.2 mg/dL — ABNORMAL LOW (ref 8.9–10.3)
Chloride: 105 mmol/L (ref 98–111)
Creatinine, Ser: 1.6 mg/dL — ABNORMAL HIGH (ref 0.44–1.00)
GFR, Estimated: 32 mL/min — ABNORMAL LOW (ref >60)
Glucose, Bld: 96 mg/dL (ref 70–99)
Potassium: 3.7 mmol/L (ref 3.5–5.1)
Sodium: 135 mmol/L (ref 135–145)

## 2022-12-15 MED ORDER — CARVEDILOL 6.25 MG PO TABS: 6.2500 mg | ORAL_TABLET | Freq: Two times a day (BID) | ORAL | 3 refills | Status: DC

## 2022-12-15 MED ORDER — AMLODIPINE BESYLATE 5 MG PO TABS: 5.0000 mg | ORAL_TABLET | Freq: Every day | ORAL | 11 refills | Status: DC

## 2022-12-15 NOTE — Plan of Care (Signed)
  Problem: Education: Goal: Knowledge of General Education information will improve Description Including pain rating scale, medication(s)/side effects and non-pharmacologic comfort measures Outcome: Progressing   

## 2022-12-15 NOTE — Progress Notes (Signed)
Occupational Therapy Treatment Patient Details Name: Sophia Schroeder MRN: [id number] DOB: 12/26/1940 Today's Date: 12/15/2022   History of present illness Sophia Schroeder is a 82 y.o. female who presents with syncopal episode on 12/12/22. CT head clear. Noted to have AKI on CKD stage 3b  Orthostatic BP has been positive. Marland Kitchen PMHx: CAD with CABG X 4 , HTN, HLD.   OT comments  Pt progressing toward established OT goals. Pt continues with inconsistencies in orientation, reporting it is 2023. Pt with scoring a 16 on short blessed test indicating cognitive impairment. Pt and family educated regarding safety with mobility and monitoring BP as well as encouraged to ask for continued cognitive assessment from [facility]. Pt remains orthostatic, but RN confident this will improve with longer time on heart medication adjustment. Pt asymptomatic. Will continue to follow.    Recommendations for follow up therapy are one component of a multi-disciplinary discharge planning process, led by the attending physician.  Recommendations may be updated based on patient status, additional functional criteria and insurance authorization.    Assistance Recommended at Discharge Intermittent Supervision/Assistance  Patient can return home with the following  A little help with walking and/or transfers;A little help with bathing/dressing/bathroom;Assistance with cooking/housework;Direct supervision/assist for medications management;Direct supervision/assist for financial management;Assist for transportation;Help with stairs or ramp for entrance   Equipment Recommendations  Tub/shower seat    Recommendations for Other Services      Precautions / Restrictions Precautions Precautions: Fall Precaution Comments: watch BP Restrictions Weight Bearing Restrictions: No       Mobility Bed Mobility Overal bed mobility: Modified Independent             General bed mobility comments: educated regarding log roll due to pt back and  neck pain    Transfers Overall transfer level: Needs assistance Equipment used: None Transfers: Sit to/from Stand Sit to Stand: Supervision           General transfer comment: Close supervision due to orthostatic BP     Balance Overall balance assessment: Needs assistance Sitting-balance support: No upper extremity supported Sitting balance-Leahy Scale: Good     Standing balance support: No upper extremity supported Standing balance-Leahy Scale: Good                             ADL either performed or assessed with clinical judgement   ADL Overall ADL's : Needs assistance/impaired     Grooming: Supervision/safety;Standing                   Toilet Transfer: Retail banker;Ambulation;Min guard Statistician Details (indicate cue type and reason): going to restroom with family on arrival         Functional mobility during ADLs: Supervision/safety;Min guard General ADL Comments: Pt continues to be orthostatic, but MAP more consistent    Extremity/Trunk Assessment Upper Extremity Assessment Upper Extremity Assessment: Generalized weakness   Lower Extremity Assessment Lower Extremity Assessment: Defer to PT evaluation        Vision   Vision Assessment?: No apparent visual deficits   Perception     Praxis      Cognition Arousal/Alertness: Awake/alert Behavior During Therapy: WFL for tasks assessed/performed Overall Cognitive Status: Impaired/Different from baseline Area of Impairment: Orientation, Attention, Memory, Following commands, Awareness, Problem solving, Safety/judgement                 Orientation Level: Disoriented to, Time (reporting 2023) Current Attention Level: Focused, Sustained Memory:  Decreased recall of precautions Following Commands: Follows one step commands with increased time Safety/Judgement: Decreased awareness of safety, Decreased awareness of deficits Awareness:  Intellectual Problem Solving: Requires verbal cues, Slow processing General Comments: Follows basic commands; per OT note baseline for pt to be disoriented to month and year; pt does need redirecting at times for attention to task. pt performing short blessed test and scoring a 16 on short blessed test with difficulty with orientation to year, delayed memory recall, and stating months of the year in the reverse order.        Exercises      Shoulder Instructions       General Comments supine 153/63 (89) HR 57; seated 100/88 (94) HR 61; standing 116/56 (76) HR 63; standing 3 mins 111/79 (91) HR 67. Family and pt education i regard to expectations for follow up therapies and monitoring of BPs on new medication    Pertinent Vitals/ Pain       Pain Assessment Pain Assessment: Faces Faces Pain Scale: Hurts little more Pain Location: neck Pain Descriptors / Indicators: Tightness Pain Intervention(s): Limited activity within patient's tolerance, Monitored during session  Home Living                                          Prior Functioning/Environment              Frequency  Min 2X/week        Progress Toward Goals  OT Goals(current goals can now be found in the care plan section)  Progress towards OT goals: Progressing toward goals  Acute Rehab OT Goals Patient Stated Goal: get better OT Goal Formulation: With patient/family Time For Goal Achievement: 12/28/22 Potential to Achieve Goals: Good ADL Goals Pt Will Perform Grooming: standing;with modified independence Pt Will Perform Lower Body Dressing: with modified independence;sit to/from stand Pt Will Transfer to Toilet: with modified independence;ambulating;regular height toilet Additional ADL Goal #1: Pt will complete 2 step ADL task with min cues  Plan Discharge plan remains appropriate;Frequency remains appropriate    Co-evaluation                 AM-PAC OT "6 Clicks" Daily Activity      Outcome Measure   Help from another person eating meals?: None Help from another person taking care of personal grooming?: A Little Help from another person toileting, which includes using toliet, bedpan, or urinal?: A Little Help from another person bathing (including washing, rinsing, drying)?: A Little Help from another person to put on and taking off regular upper body clothing?: A Little Help from another person to put on and taking off regular lower body clothing?: A Little 6 Click Score: 19    End of Session    OT Visit Diagnosis: Unsteadiness on feet (R26.81);Muscle weakness (generalized) (M62.81);History of falling (Z91.81);Dizziness and giddiness (R42)   Activity Tolerance Patient tolerated treatment well   Patient Left in bed;with call bell/phone within reach;with family/visitor present   Nurse Communication Mobility status        Time: 8657-8469 OT Time Calculation (min): 36 min  Charges: OT General Charges $OT Visit: 1 Visit OT Treatments $Self Care/Home Management : 23-37 mins  Tyler Deis, OTR/L [facility] Acute Rehabilitation Office: [phone]   Myrla Halsted 12/15/2022, 11:24 AM

## 2022-12-15 NOTE — Discharge Summary (Signed)
Physician Discharge Summary   Patient: Sophia Schroeder MRN: [id number] DOB: [birth year]  Admit date:     12/12/2022  Discharge date: 12/15/22  Discharge Physician: Thad Ranger, MD    PCP: Verlon Au, MD   Recommendations at discharge:   Hold amlodipine-benazepril Hold hydrochlorothiazide Decreased Coreg to 6.25 mg twice daily Amlodipine 5 mg daily Follow renal function outpatient at the follow-up visit in 1 to 2 weeks and adjust medications accordingly  Discharge Diagnoses:    Syncope and collapse Orthostatic hypotension   AKI (acute kidney injury) (HCC) chronic kidney disease stage IIIb (HCC)    HYPERTENSION, BENIGN   CAD, AUTOLOGOUS BYPASS GRAFT     Hospital Course: Patient is a  82 y.o. female with CAD with CABG X 4 , HTN, HLD presents with syncopal episode. She went to see her PCP on the day of admission and was a given a steroid shot for neck pain . After she went home, she had a syncopal episode which was witnessed by her son. He found her slumped in a chair with her eyes open, mouth moving but no words coming out so he called 911. This episode lasted 30 seconds. No LOC. Of note pt also has been feeling dizziness for the last few days, has been off her food and had a fall at home. Denied seizures, post ictal state, nausea, vomiting, headache, vision changes, speech changes, unilateral limb weakness, paraesthesia/numbness, ataxia or urinary/bowel incontinence.    CXR: Mild cardiomegaly CT head: No evidence of acute infarction  CT c-spine: Degenerative changes throughout the cervical spine likely account for anterior subluxations at C4-5 and C7-T1. No acute displaced fractures are identified. Patchy infiltrates in the right lung may indicate pneumonia. No acute intracranial abnormalities.  Mild cerebral atrophy.  Assessment and Plan:    Syncope and collapse -Patient noted to be orthostatic hypotensive, postural with PT evaluation -Likely due to AKI and  dehydration -2D echo showed EF of 65 to 70%, G1 DD, no regional WMA -Patient was placed on IV fluid hydration.  HCTZ, benazepril held -TSH, B12, folate normal      AKI (acute kidney injury) (HCC) on CKD stage IIIb -Baseline creatinine 1.7 in 01/2022 per PCP notes -Hold HCTZ, benazepril -Renal ultrasound negative for any obstruction or hydronephrosis.  UA negative for proteinuria -Will need outpatient nephrology follow-up -Patient was placed on IV fluid hydration  -Creatinine 2.3 on admission, improved to 1.6 on discharge.   Orthostatic with history of HYPERTENSION, BENIGN -decrease Coreg to 6.25 mg twice daily, hold ARB, HCTZ as above -Stable, improving -Placed on amlodipine 5 mg daily       CAD, AUTOLOGOUS BYPASS GRAFT - Currently no chest pain or acute shortness of breath - continue ASA, BB, zetia      Estimated body mass index is 25.9 kg/m as calculated from the following:   Height as of this encounter: 5\' 5"  (1.651 m).   Weight as of this encounter: 70.6 kg.       Pain control - [employer] Controlled Substance Reporting System database was reviewed. and patient was instructed, not to drive, operate heavy machinery, perform activities at heights, swimming or participation in water activities or provide baby-sitting services while on Pain, Sleep and Anxiety Medications; until their outpatient Physician has advised to do so again. Also recommended to not to take more than prescribed Pain, Sleep and Anxiety Medications.  Consultants: None Procedures performed: None Disposition: Home Diet recommendation:  Discharge Diet Orders (From admission, onward)  Start     Ordered   12/15/22 0000  Diet general        12/15/22 0826            DISCHARGE MEDICATION: Allergies as of 12/15/2022   No Known Allergies      Medication List     STOP taking these medications    amLODipine-benazepril 10-20 MG capsule Commonly known as: LOTREL   hydrochlorothiazide 12.5  MG capsule Commonly known as: MICROZIDE       TAKE these medications    amLODipine 5 MG tablet Commonly known as: NORVASC Take 1 tablet (5 mg total) by mouth daily.   aspirin EC 81 MG tablet Take 81 mg by mouth daily.   atorvastatin 80 MG tablet Commonly known as: LIPITOR NEED OV.   calcium carbonate 600 MG Tabs tablet Commonly known as: OS-CAL Take 600 mg by mouth. occasionally   carvedilol 6.25 MG tablet Commonly known as: COREG Take 1 tablet (6.25 mg total) by mouth 2 (two) times daily. What changed: Another medication with the same name was removed. Continue taking this medication, and follow the directions you see here.   diclofenac Sodium 1 % Gel Commonly known as: VOLTAREN Apply 2 g topically 2 (two) times daily as needed (pain).   ezetimibe 10 MG tablet Commonly known as: ZETIA TAKE 1 TABLET EVERY DAY (NEED TO SCHEDULE AN OFFICE VISIT FOR FUTURE REFILLS)   multivitamin tablet Take 1 tablet by mouth daily.   potassium chloride 10 MEQ tablet Commonly known as: KLOR-CON Take 1 tablet (10 mEq total) by mouth daily.   sertraline 25 MG tablet Commonly known as: ZOLOFT Take 25 mg by mouth daily.        Follow-up Information     Care, [facility] Follow up.   Specialty: Home Health Services Why: [facility] will be providing home health services.  They will call you within 24-48 hours of your discharge home to set up therapy and RN services. Contact information: [address] [place] [address] [phone]         Verlon Au, MD. Schedule an appointment as soon as possible for a visit in 10 day(s).   Specialty: Family Medicine Why: for hospital follow-up Contact information: [address] [facility] [place] [address] [phone]                Discharge Exam: Filed Weights   12/12/22 2057  Weight: 70.6 kg   S: Doing well, sitting up, no chest pain, shortness of breath,  dizziness.  Daughter and son at the bedside.  Feels close to her baseline.  Hoping to go home today  BP 128/70 (BP Location: Right Arm)   Pulse 63   Temp 98 F (36.7 C)   Resp 18   Ht 5\' 5"  (1.651 m)   Wt 70.6 kg   SpO2 98%   BMI 25.90 kg/m   Physical Exam General: Alert and oriented x 3, NAD Cardiovascular: S1 S2 clear, RRR.  Respiratory: CTAB, no wheezing Gastrointestinal: Soft, nontender, nondistended, NBS Ext: no pedal edema bilaterally Neuro: no new deficits Psych: Normal affect  Condition at discharge: fair  The results of significant diagnostics from this hospitalization (including imaging, microbiology, ancillary and laboratory) are listed below for reference.   Imaging Studies: US RENAL  Result Date: 12/14/2022 CLINICAL DATA:  Renal dysfunction EXAM: RENAL / URINARY TRACT ULTRASOUND COMPLETE COMPARISON:  02/14/2022 FINDINGS: Right Kidney: Renal measurements: 9.1 x 3.7  x 3.9 cm = volume: 68.1 mL. There is no hydronephrosis. There is slightly increased cortical echogenicity. Left Kidney: Renal measurements: 9.2 x 4.7 x 3.9 cm = volume: 86.6 mL. There is no hydronephrosis. There is slightly increased cortical echogenicity. Bladder: Appears normal for degree of bladder distention. Other: Incidental note is made of gallbladder stones. IMPRESSION: There is no hydronephrosis. Incidental note is made of gallbladder stones. Electronically Signed   By: Ernie Avena M.D.   On: 12/14/2022 13:08   ECHOCARDIOGRAM COMPLETE  Result Date: 12/13/2022    ECHOCARDIOGRAM REPORT   Patient Name:   Sophia Schroeder Date of Exam: 12/13/2022 Medical Rec #:  [id number]     Height:       65.0 in Accession #:    [id number]    Weight:       155.6 lb Date of Birth:  [birth year]    BSA:          1.778 m Patient Age:    81 years      BP:           120/77 mmHg Patient Gender: F             HR:           66 bpm. Exam Location:  Inpatient Procedure: 2D Echo, Cardiac Doppler and Color Doppler Indications:     Syncope R55  History:        Patient has no prior history of Echocardiogram examinations.                 CAD, Prior Cardiac Surgery, Carotid Disease and PAD; Risk                 Factors:Non-Smoker and Dyslipidemia.  Sonographer:    Dondra Prader RVT RCS Referring Phys: Wayne Both, J  Sonographer Comments: Technically difficult study due to poor echo windows, suboptimal apical window and suboptimal parasternal window. Declined definity IMPRESSIONS  1. Left ventricular ejection fraction, by estimation, is 65 to 70%. The left ventricle has normal function. The left ventricle has no regional wall motion abnormalities. There is moderate concentric left ventricular hypertrophy. Left ventricular diastolic parameters are consistent with Grade I diastolic dysfunction (impaired relaxation).  2. Right ventricular systolic function is normal. The right ventricular size is normal.  3. Left atrial size was mildly dilated.  4. The mitral valve is normal in structure. No evidence of mitral valve regurgitation. No evidence of mitral stenosis.  5. The aortic valve was not well visualized. There is mild calcification of the aortic valve. Aortic valve regurgitation is not visualized. Aortic valve sclerosis/calcification is present, without any evidence of aortic stenosis.  6. The inferior vena cava is dilated in size with >50% respiratory variability, suggesting right atrial pressure of 8 mmHg. FINDINGS  Left Ventricle: Left ventricular ejection fraction, by estimation, is 65 to 70%. The left ventricle has normal function. The left ventricle has no regional wall motion abnormalities. The left ventricular internal cavity size was normal in size. There is  moderate concentric left ventricular hypertrophy. Left ventricular diastolic parameters are consistent with Grade I diastolic dysfunction (impaired relaxation). Right Ventricle: The right ventricular size is normal. No increase in right ventricular wall thickness. Right  ventricular systolic function is normal. Left Atrium: Left atrial size was mildly dilated. Right Atrium: Right atrial size was normal in size. Pericardium: There is no evidence of pericardial effusion. Mitral Valve: The mitral valve is normal in structure. No evidence of mitral valve regurgitation. No  evidence of mitral valve stenosis. Tricuspid Valve: The tricuspid valve is normal in structure. Tricuspid valve regurgitation is trivial. No evidence of tricuspid stenosis. Aortic Valve: The aortic valve was not well visualized. There is mild calcification of the aortic valve. Aortic valve regurgitation is not visualized. Aortic valve sclerosis/calcification is present, without any evidence of aortic stenosis. Aortic valve mean gradient measures 5.5 mmHg. Aortic valve peak gradient measures 9.6 mmHg. Aortic valve area, by VTI measures 2.63 cm. Pulmonic Valve: The pulmonic valve was not well visualized. Pulmonic valve regurgitation is not visualized. No evidence of pulmonic stenosis. Aorta: The aortic root is normal in size and structure. Venous: The inferior vena cava is dilated in size with greater than 50% respiratory variability, suggesting right atrial pressure of 8 mmHg. IAS/Shunts: No atrial level shunt detected by color flow Doppler.  LEFT VENTRICLE PLAX 2D LVIDd:         5.20 cm   Diastology LVIDs:         2.90 cm   LV e' medial:    5.87 cm/s LV PW:         0.90 cm   LV E/e' medial:  12.3 LV IVS:        1.10 cm   LV e' lateral:   9.25 cm/s LVOT diam:     2.20 cm   LV E/e' lateral: 7.8 LV SV:         97 LV SV Index:   55 LVOT Area:     3.80 cm  RIGHT VENTRICLE             IVC RV S prime:     12.30 cm/s  IVC diam: 2.10 cm TAPSE (M-mode): 1.6 cm LEFT ATRIUM             Index        RIGHT ATRIUM           Index LA diam:        3.90 cm 2.19 cm/m   RA Area:     14.30 cm LA Vol (A2C):   64.9 ml 36.50 ml/m  RA Volume:   34.90 ml  19.63 ml/m LA Vol (A4C):   51.2 ml 28.79 ml/m LA Biplane Vol: 64.5 ml 36.27 ml/m   AORTIC VALVE                     PULMONIC VALVE AV Area (Vmax):    2.67 cm      PV Vmax:       1.48 m/s AV Area (Vmean):   2.68 cm      PV Peak grad:  8.8 mmHg AV Area (VTI):     2.63 cm AV Vmax:           155.00 cm/s AV Vmean:          108.000 cm/s AV VTI:            0.368 m AV Peak Grad:      9.6 mmHg AV Mean Grad:      5.5 mmHg LVOT Vmax:         109.00 cm/s LVOT Vmean:        76.000 cm/s LVOT VTI:          0.255 m LVOT/AV VTI ratio: 0.69  AORTA Ao Root diam: 3.00 cm Ao Asc diam:  3.50 cm MITRAL VALVE MV Area (PHT): 3.77 cm    SHUNTS MV Decel Time: 201 msec    Systemic VTI:  0.26 m MV E velocity: 72.30 cm/s  Systemic Diam: 2.20 cm MV A velocity: 80.70 cm/s MV E/A ratio:  0.90 Arvilla Meres MD Electronically signed by Arvilla Meres MD Signature Date/Time: 12/13/2022/12:31:23 PM    Final    DG Chest Portable 1 View  Result Date: 12/12/2022 CLINICAL DATA:  Poor appetite weakness EXAM: PORTABLE CHEST 1 VIEW COMPARISON:  10/15/2001 FINDINGS: Post sternotomy changes. Mild cardiomegaly. No acute airspace disease or pleural effusion. Aortic atherosclerosis. IMPRESSION: Mild cardiomegaly. Electronically Signed   By: Jasmine Pang M.D.   On: 12/12/2022 21:38   CT HEAD WO CONTRAST  Result Date: 12/12/2022 CLINICAL DATA:  Syncope or presyncope with cerebrovascular cause suspected. Loss of appetite and weakness. Recent steroid shot for neck pain. EXAM: CT HEAD WITHOUT CONTRAST CT CERVICAL SPINE WITHOUT CONTRAST TECHNIQUE: Multidetector CT imaging of the head and cervical spine was performed following the standard protocol without intravenous contrast. Multiplanar CT image reconstructions of the cervical spine were also generated. RADIATION DOSE REDUCTION: This exam was performed according to the departmental dose-optimization program which includes automated exposure control, adjustment of the mA and/or kV according to patient size and/or use of iterative reconstruction technique. COMPARISON:  None Available.  FINDINGS: CT HEAD FINDINGS Brain: No evidence of acute infarction, hemorrhage, hydrocephalus, extra-axial collection or mass lesion/mass effect. Mild cerebral atrophy. Vascular: No hyperdense vessel or unexpected calcification. Skull: Normal. Negative for fracture or focal lesion. Sinuses/Orbits: No acute finding. Other: None. CT CERVICAL SPINE FINDINGS Alignment: Slight anterior subluxations demonstrated at C4-5 and C7-T1. These changes are nonspecific but most likely degenerative. Normal alignment of the posterior elements. C1-2 articulation appears intact. Skull base and vertebrae: Skull base appears intact. No vertebral compression deformities. No focal bone lesion or bone destruction. Soft tissues and spinal canal: No prevertebral soft tissue swelling. No abnormal paraspinal soft tissue mass or infiltration. Disc levels: Degenerative changes throughout with disc space narrowing and endplate osteophyte formation. Degenerative changes are most prominent at C5-6 and C6-7 levels. Prominent degenerative changes are noted in the upper thoracic spine with bridging anterior osteophytes. Degenerative changes throughout the facet joints. Degenerative changes in the temporomandibular joints. Upper chest: Patchy infiltrates are suggested in the right lung, possibly indicating pneumonia. Aspiration could be a secondary consideration in the appropriate clinical setting. Sternotomy wires. Aortic calcification. Other: None. IMPRESSION: 1. Degenerative changes throughout the cervical spine likely account for anterior subluxations at C4-5 and C7-T1. No acute displaced fractures are identified. 2. Patchy infiltrates in the right lung may indicate pneumonia. 3. No acute intracranial abnormalities.  Mild cerebral atrophy. Electronically Signed   By: Burman Nieves M.D.   On: 12/12/2022 20:01   CT Cervical Spine Wo Contrast  Result Date: 12/12/2022 CLINICAL DATA:  Syncope or presyncope with cerebrovascular cause suspected.  Loss of appetite and weakness. Recent steroid shot for neck pain. EXAM: CT HEAD WITHOUT CONTRAST CT CERVICAL SPINE WITHOUT CONTRAST TECHNIQUE: Multidetector CT imaging of the head and cervical spine was performed following the standard protocol without intravenous contrast. Multiplanar CT image reconstructions of the cervical spine were also generated. RADIATION DOSE REDUCTION: This exam was performed according to the departmental dose-optimization program which includes automated exposure control, adjustment of the mA and/or kV according to patient size and/or use of iterative reconstruction technique. COMPARISON:  None Available. FINDINGS: CT HEAD FINDINGS Brain: No evidence of acute infarction, hemorrhage, hydrocephalus, extra-axial collection or mass lesion/mass effect. Mild cerebral atrophy. Vascular: No hyperdense vessel or unexpected calcification. Skull: Normal. Negative for fracture or focal lesion. Sinuses/Orbits:  No acute finding. Other: None. CT CERVICAL SPINE FINDINGS Alignment: Slight anterior subluxations demonstrated at C4-5 and C7-T1. These changes are nonspecific but most likely degenerative. Normal alignment of the posterior elements. C1-2 articulation appears intact. Skull base and vertebrae: Skull base appears intact. No vertebral compression deformities. No focal bone lesion or bone destruction. Soft tissues and spinal canal: No prevertebral soft tissue swelling. No abnormal paraspinal soft tissue mass or infiltration. Disc levels: Degenerative changes throughout with disc space narrowing and endplate osteophyte formation. Degenerative changes are most prominent at C5-6 and C6-7 levels. Prominent degenerative changes are noted in the upper thoracic spine with bridging anterior osteophytes. Degenerative changes throughout the facet joints. Degenerative changes in the temporomandibular joints. Upper chest: Patchy infiltrates are suggested in the right lung, possibly indicating pneumonia.  Aspiration could be a secondary consideration in the appropriate clinical setting. Sternotomy wires. Aortic calcification. Other: None. IMPRESSION: 1. Degenerative changes throughout the cervical spine likely account for anterior subluxations at C4-5 and C7-T1. No acute displaced fractures are identified. 2. Patchy infiltrates in the right lung may indicate pneumonia. 3. No acute intracranial abnormalities.  Mild cerebral atrophy. Electronically Signed   By: Burman Nieves M.D.   On: 12/12/2022 20:01    Microbiology: Results for orders placed or performed during the hospital encounter of 12/12/22  Culture, OB Urine     Status: Abnormal   Collection Time: 12/12/22  6:42 PM   Specimen: Urine, Random  Result Value Ref Range Status   Specimen Description URINE, RANDOM  Final   Special Requests NONE  Final   Culture (A)  Final    >=100,000 COLONIES/mL GROUP B STREP(S.AGALACTIAE)ISOLATED TESTING AGAINST S. AGALACTIAE NOT ROUTINELY PERFORMED DUE TO PREDICTABILITY OF AMP/PEN/VAN SUSCEPTIBILITY. Performed at [facility], [address] [place], [place] [address]    Report Status 12/14/2022 FINAL  Final    Labs: CBC: Recent Labs  Lab 12/12/22 1849 12/13/22 0113 12/13/22 0337  WBC 10.1 6.5 5.0  HGB 11.5* 10.9* 10.5*  HCT 34.5* 32.2* 30.3*  MCV 90.6 90.4 91.0  PLT 204 211 200   Basic Metabolic Panel: Recent Labs  Lab 12/12/22 1849 12/13/22 0113 12/13/22 0337 12/14/22 0033 12/15/22 0019  NA 134*  --  133* 136 135  K 3.5  --  3.3* 4.1 3.7  CL 98  --  98 104 105  CO2 20*  --  22 23 23   GLUCOSE 159*  --  180* 126* 96  BUN 38*  --  41* 42* 32*  CREATININE 2.33* 2.33* 2.16* 2.00* 1.60*  CALCIUM 8.7*  --  8.6* 8.4* 8.2*  MG  --   --   --  1.8  --    Liver Function Tests: Recent Labs  Lab 12/12/22 1849 12/13/22 0337 12/14/22 0033  AST 24 23 26   ALT 19 20 22   ALKPHOS 68 63 54  BILITOT 1.7* 1.2 1.1  PROT 8.1 7.2 6.7  ALBUMIN 3.3* 2.9* 2.7*   CBG: No results for  input(s): "GLUCAP" in the last 168 hours.  Discharge time spent: greater than 30 minutes.  Signed: Thad Ranger, MD [facility] 12/15/2022

## 2022-12-15 NOTE — Care Management Important Message (Signed)
Important Message  Patient Details  Name: Sophia Schroeder MRN: [id number] Date of Birth: Aug 31, 1940   Medicare Important Message Given:  Yes  Patient left prior to IM delivery will mail a copy to the patient home adress   Dorena Bodo 12/15/2022, 1:05 PM

## 2022-12-16 ENCOUNTER — Other Ambulatory Visit: Payer: Self-pay

## 2022-12-16 MED ORDER — CARVEDILOL 6.25 MG PO TABS: 6.2500 mg | ORAL_TABLET | Freq: Two times a day (BID) | ORAL | 3 refills | Status: DC

## 2022-12-16 MED ORDER — ATORVASTATIN CALCIUM 80 MG PO TABS: 80.0000 mg | ORAL_TABLET | Freq: Every day | ORAL | 3 refills | Status: DC

## 2022-12-16 MED ORDER — AMLODIPINE BESYLATE 5 MG PO TABS: 5.0000 mg | ORAL_TABLET | Freq: Every day | ORAL | 3 refills | Status: AC

## 2023-02-25 ENCOUNTER — Other Ambulatory Visit: Payer: Self-pay | Admitting: Adult Health

## 2023-08-10 ENCOUNTER — Other Ambulatory Visit: Payer: Self-pay | Admitting: *Deleted

## 2023-08-10 DIAGNOSIS — I6523 Occlusion and stenosis of bilateral carotid arteries: Secondary | ICD-10-CM

## 2023-09-26 ENCOUNTER — Ambulatory Visit (HOSPITAL_COMMUNITY)
Admission: RE | Admit: 2023-09-26 | Discharge: 2023-09-26 | Disposition: A | Discharge status: Home / Self Care | Source: Ambulatory Visit | Attending: Cardiology | Admitting: Cardiology

## 2023-09-26 DIAGNOSIS — I6523 Occlusion and stenosis of bilateral carotid arteries: Secondary | ICD-10-CM | POA: Diagnosis present

## 2023-09-27 ENCOUNTER — Other Ambulatory Visit (HOSPITAL_BASED_OUTPATIENT_CLINIC_OR_DEPARTMENT_OTHER): Payer: Self-pay | Admitting: *Deleted

## 2023-09-27 DIAGNOSIS — I6523 Occlusion and stenosis of bilateral carotid arteries: Secondary | ICD-10-CM

## 2023-10-07 ENCOUNTER — Other Ambulatory Visit: Payer: Self-pay | Admitting: Cardiology

## 2023-10-31 ENCOUNTER — Other Ambulatory Visit: Payer: Self-pay | Admitting: Cardiology

## 2023-11-23 ENCOUNTER — Other Ambulatory Visit: Payer: Self-pay | Admitting: Cardiology

## 2023-11-25 ENCOUNTER — Other Ambulatory Visit: Payer: Self-pay | Admitting: Cardiology

## 2023-12-18 ENCOUNTER — Other Ambulatory Visit: Payer: Self-pay | Admitting: Cardiology

## 2024-01-10 ENCOUNTER — Other Ambulatory Visit: Payer: Self-pay | Admitting: Cardiology

## 2024-02-19 ENCOUNTER — Other Ambulatory Visit: Payer: Self-pay | Admitting: Adult Health

## 2024-02-19 DIAGNOSIS — E7800 Pure hypercholesterolemia, unspecified: Secondary | ICD-10-CM

## 2024-03-25 ENCOUNTER — Other Ambulatory Visit: Payer: Self-pay | Admitting: Cardiology

## 2024-04-02 ENCOUNTER — Telehealth: Payer: Self-pay | Admitting: Cardiology

## 2024-04-02 MED ORDER — CARVEDILOL 6.25 MG PO TABS: 6.2500 mg | ORAL_TABLET | Freq: Two times a day (BID) | ORAL | 2 refills | Status: AC

## 2024-04-02 NOTE — Telephone Encounter (Signed)
*  STAT* If patient is at the pharmacy, call can be transferred to refill team.   1. Which medications need to be refilled? (please list name of each medication and dose if known)   carvedilol  (COREG ) 6.25 MG tablet   2. Would you like to learn more about the convenience, safety, & potential cost savings by using the [facility]?   3. Are you open to using the [facility] (Type [facility]. ).  4. Which pharmacy/location (including street and city if local pharmacy) is medication to be sent to?  [id number] - [place], [place] - [address] AT [address]   5. Do they need a 30 day or 90 day supply?   Daughter [place]) stated patient has medication left.  Patient has appointment scheduled with Dr. Pietro on 1/16.

## 2024-04-02 NOTE — Telephone Encounter (Signed)
 Pt's medication was sent to pt's pharmacy as requested. Confirmation received.

## 2024-04-05 ENCOUNTER — Other Ambulatory Visit: Payer: Self-pay | Admitting: Cardiology

## 2024-05-01 ENCOUNTER — Other Ambulatory Visit: Payer: Self-pay | Admitting: Cardiology

## 2024-05-13 ENCOUNTER — Telehealth: Payer: Self-pay | Admitting: Cardiology

## 2024-05-13 MED ORDER — ATORVASTATIN CALCIUM 80 MG PO TABS: 80.0000 mg | ORAL_TABLET | Freq: Every day | ORAL | 0 refills | Status: AC

## 2024-05-13 NOTE — Telephone Encounter (Signed)
 Refills has been sent to the pharmacy.

## 2024-05-13 NOTE — Telephone Encounter (Signed)
°*  STAT* If patient is at the pharmacy, call can be transferred to refill team.   1. Which medications need to be refilled? (please list name of each medication and dose if known)   atorvastatin  (LIPITOR ) 80 MG tablet   NEW PHARMACY   4. Which pharmacy/location (including street and city if local pharmacy) is medication to be sent to?  [id number] - [place], [place] - [address] AT [facility] [address] [facility] [address] Phone: [phone]  Fax: [phone]       5. Do they need a 30 day or 90 day supply? 90

## 2024-05-18 ENCOUNTER — Other Ambulatory Visit: Payer: Self-pay | Admitting: Adult Health

## 2024-05-20 ENCOUNTER — Telehealth: Payer: Self-pay | Admitting: Cardiology

## 2024-05-20 DIAGNOSIS — E7800 Pure hypercholesterolemia, unspecified: Secondary | ICD-10-CM

## 2024-05-20 NOTE — Telephone Encounter (Signed)
" °*  STAT* If patient is at the pharmacy, call can be transferred to refill team.   1. Which medications need to be refilled? (please list name of each medication and dose if known)   ezetimibe  (ZETIA ) 10 MG tablet    4. Which pharmacy/location (including street and city if local pharmacy) is medication to be sent to? [id number] - [place], [place] - [address] AT [address]     5. Do they need a 30 day or 90 day supply? 90   "

## 2024-05-21 MED ORDER — EZETIMIBE 10 MG PO TABS: 10.0000 mg | ORAL_TABLET | Freq: Every day | ORAL | 0 refills | Status: DC

## 2024-05-21 NOTE — Telephone Encounter (Signed)
 Pt scheduled 06/14/24, refill sent to get pt to appointment.

## 2024-06-03 NOTE — Progress Notes (Signed)
 "    HPI: FU coronary artery disease. In 2003 the patient underwent coronary artery bypass graft with a LIMA to the LAD, saphenous vein graft to the second diagonal, sequential saphenous vein graft to the first diagonal and obtuse marginal and a sequential saphenous vein graft to the PDA and posterior lateral. Nuclear study 10/16 showed EF 49; infarct with mild peri-infarct ischemia; treated medically. Echocardiogram July 2024 showed normal LV function, moderate left ventricular hypertrophy, grade 1 diastolic dysfunction, mild left atrial enlargement.  Carotid Dopplers April 2025 showed 1 to 39% right and 40 to 59% left stenosis.  Since last seen she denies dyspnea, chest pain, palpitations or syncope.  Current Outpatient Medications  Medication Sig Dispense Refill   aspirin  81 MG EC tablet Take 81 mg by mouth daily.     atorvastatin  (LIPITOR ) 80 MG tablet Take 1 tablet (80 mg total) by mouth daily. KEEP JANUARY APPOINTMENT. 90 tablet 0   calcium  carbonate (OS-CAL) 600 MG TABS Take 600 mg by mouth. occasionally     carvedilol  (COREG ) 6.25 MG tablet Take 1 tablet (6.25 mg total) by mouth 2 (two) times daily. 30 tablet 2   diclofenac Sodium (VOLTAREN) 1 % GEL Apply 2 g topically 2 (two) times daily as needed (pain).     ezetimibe  (ZETIA ) 10 MG tablet Take 1 tablet (10 mg total) by mouth daily. 25 tablet 0   Multiple Vitamin (MULTIVITAMIN) tablet Take 1 tablet by mouth daily.     potassium chloride  (KLOR-CON ) 10 MEQ tablet TAKE 1 TABLET(10 MEQ) BY MOUTH DAILY 30 tablet 0   sertraline  (ZOLOFT ) 25 MG tablet Take 25 mg by mouth daily.     amLODipine  (NORVASC ) 5 MG tablet Take 1 tablet (5 mg total) by mouth daily. (Patient not taking: Reported on 06/14/2024) 90 tablet 3   No current facility-administered medications for this visit.     Past Medical History:  Diagnosis Date   Benign hypertension    Carotid artery stenosis    without infarction   Coronary atherosclerosis of autologous vein bypass  graft    Mixed hyperlipidemia     Past Surgical History:  Procedure Laterality Date   CATARACT EXTRACTION  june 2013   CORONARY ARTERY BYPASS GRAFT  10/10/2001   NOSE SURGERY     right total hip arthroplasty  03/11/2002   TUBAL LIGATION      Social History   Socioeconomic History   Marital status: Married    Spouse name: Not on file   Number of children: Not on file   Years of education: Not on file   Highest education level: Not on file  Occupational History   Not on file  Tobacco Use   Smoking status: Never   Smokeless tobacco: Never  Substance and Sexual Activity   Alcohol use: No   Drug use: No   Sexual activity: Not on file  Other Topics Concern   Not on file  Social History Narrative   Not on file   Social Drivers of Health   Tobacco Use: Low Risk (06/14/2024)   Patient History    Smoking Tobacco Use: Never    Smokeless Tobacco Use: Never    Passive Exposure: Not on file  Financial Resource Strain: Not on file  Food Insecurity: Low Risk (05/18/2023)   Received from [facility]   Epic    Within the past 12 months, you worried that your food would run out before you got money to buy more: Never true  Within the past 12 months, the food you bought just didn't last and you didn't have money to get more. : Never true  Transportation Needs: No Transportation Needs (05/18/2023)   Received from [facility]    In the past 12 months, has lack of reliable transportation kept you from medical appointments, meetings, work or from getting things needed for daily living? : No  Physical Activity: Not on file  Stress: Not on file  Social Connections: Not on file  Intimate Partner Violence: Not on file  Depression (EYV7-0): Not on file  Alcohol Screen: Not on file  Housing: Low Risk (05/18/2023)   Received from [facility]   Epic    What is your living situation today?: I have a steady place to live    Think about the place you live. Do you  have problems with any of the following? Choose all that apply:: None/None on this list  Utilities: Low Risk (05/18/2023)   Received from [facility]   Utilities    In the past 12 months has the electric, gas, oil, or water company threatened to shut off services in your home? : No  Health Literacy: Not on file    Family History  Problem Relation Age of Onset   Heart disease Father    Heart attack Father    Stroke Mother    Arthritis Mother     ROS: no fevers or chills, productive cough, hemoptysis, dysphasia, odynophagia, melena, hematochezia, dysuria, hematuria, rash, seizure activity, orthopnea, PND, pedal edema, claudication. Remaining systems are negative.  Physical Exam: Well-developed well-nourished in no acute distress.  Skin is warm and dry.  HEENT is normal.  Neck is supple.  Chest is clear to auscultation with normal expansion.  Cardiovascular exam is regular rate and rhythm.  Abdominal exam nontender or distended. No masses palpated. Extremities show no edema. neuro grossly intact  EKG Interpretation Date/Time:  Friday June 14 2024 08:54:13 EST Ventricular Rate:  64 PR Interval:  234 QRS Duration:  80 QT Interval:  390 QTC Calculation: 402 R Axis:   0  Text Interpretation: Sinus rhythm with 1st degree A-V block Septal infarct , age undetermined T wave abnormality, consider lateral ischemia Confirmed by Pietro Rogue ([id number]) on 06/14/2024 8:55:43 AM    A/P  1 coronary artery disease-patient doing well with no chest pain.  Continue medical therapy with aspirin  and statin.  2 carotid artery disease-she will need follow-up carotid Dopplers April 2026.  3 hypertension-patient's blood pressure is elevated; amlodipine  caused pedal edema previously.  Will add losartan  50 mg daily.  Check potassium and renal function in 1 week.  Follow blood pressure and advance as needed.  4 hyperlipidemia-continue statin.  Rogue Pietro, MD    "

## 2024-06-14 ENCOUNTER — Other Ambulatory Visit: Payer: Self-pay | Admitting: Adult Health

## 2024-06-14 ENCOUNTER — Ambulatory Visit: Admitting: Cardiology

## 2024-06-14 ENCOUNTER — Encounter: Payer: Self-pay | Admitting: Cardiology

## 2024-06-14 VITALS — BP 200/78 | HR 64 | Ht 65.0 in | Wt 152.0 lb

## 2024-06-14 DIAGNOSIS — I2510 Atherosclerotic heart disease of native coronary artery without angina pectoris: Secondary | ICD-10-CM | POA: Diagnosis not present

## 2024-06-14 DIAGNOSIS — I6523 Occlusion and stenosis of bilateral carotid arteries: Secondary | ICD-10-CM

## 2024-06-14 DIAGNOSIS — E7800 Pure hypercholesterolemia, unspecified: Secondary | ICD-10-CM

## 2024-06-14 DIAGNOSIS — I10 Essential (primary) hypertension: Secondary | ICD-10-CM

## 2024-06-14 MED ORDER — LOSARTAN POTASSIUM 50 MG PO TABS: 50.0000 mg | ORAL_TABLET | Freq: Every day | ORAL | 3 refills | Status: AC

## 2024-06-14 NOTE — Patient Instructions (Signed)
 Medication Instructions:   START LOSARTAN  50 MG ONCE DAILY  *If you need a refill on your cardiac medications before your next appointment, please call your pharmacy*  Lab Work:  WITH MEDICAL DOCTOR  If you have labs (blood work) drawn today and your tests are completely normal, you will receive your results only by: MyChart Message (if you have MyChart) OR A paper copy in the mail If you have any lab test that is abnormal or we need to change your treatment, we will call you to review the results.  Follow-Up: At [facility], you and your health needs are our priority.  As part of our continuing mission to provide you with exceptional heart care, our providers are all part of one team.  This team includes your primary Cardiologist (physician) and Advanced Practice Providers or APPs (Physician Assistants and Nurse Practitioners) who all work together to provide you with the care you need, when you need it.  Your next appointment:   12 month(s)  Provider:   Redell Shallow, MD

## 2024-06-17 ENCOUNTER — Other Ambulatory Visit: Payer: Self-pay

## 2024-06-17 ENCOUNTER — Other Ambulatory Visit: Payer: Self-pay | Admitting: Cardiology

## 2024-06-17 DIAGNOSIS — E7800 Pure hypercholesterolemia, unspecified: Secondary | ICD-10-CM

## 2024-06-17 NOTE — Telephone Encounter (Signed)
 In accordance with refill protocols, please review and address the following requirements before this medication refill can be authorized:  Labs

## 2024-06-19 ENCOUNTER — Telehealth: Payer: Self-pay | Admitting: Cardiology

## 2024-06-19 ENCOUNTER — Other Ambulatory Visit: Payer: Self-pay | Admitting: Cardiology

## 2024-06-19 NOTE — Telephone Encounter (Signed)
" °*  STAT* If patient is at the pharmacy, call can be transferred to refill team.   1. Which medications need to be refilled? (please list name of each medication and dose if known)   ezetimibe  (ZETIA ) 10 MG tablet    potassium chloride  (KLOR-CON ) 10 MEQ tablet     2. Would you like to learn more about the convenience, safety, & potential cost savings by using the [facility]? No      3. Are you open to using the [facility] (Type [facility]. NO    4. Which pharmacy/location (including street and city if local pharmacy) is medication to be sent to? [id number] - [place], [place] - [address]     5. Do they need a 30 day or 90 day supply? 90 day   "

## 2024-06-20 NOTE — Telephone Encounter (Signed)
 B-Met scheduled as of 06/14/24  In accordance with refill protocols, please review and address the following requirements before this medication refill can be authorized:  Labs

## 2024-06-21 MED ORDER — EZETIMIBE 10 MG PO TABS: 10.0000 mg | ORAL_TABLET | Freq: Every day | ORAL | 3 refills | Status: AC

## 2024-06-25 NOTE — Telephone Encounter (Signed)
 Refills were sent 06/20/24 & 06/21/24.
